# Patient Record
Sex: Female | Born: 2000 | Race: White | Hispanic: No | Marital: Single | Smoking: Current every day smoker
Health system: Southern US, Community
[De-identification: ages and names within clinical notes are randomized; demographics above are authoritative.]

## PROBLEM LIST (undated history)

## (undated) DIAGNOSIS — J40 Bronchitis, not specified as acute or chronic: Secondary | ICD-10-CM

## (undated) DIAGNOSIS — F419 Anxiety disorder, unspecified: Secondary | ICD-10-CM

## (undated) DIAGNOSIS — J189 Pneumonia, unspecified organism: Secondary | ICD-10-CM

## (undated) DIAGNOSIS — T7840XA Allergy, unspecified, initial encounter: Secondary | ICD-10-CM

## (undated) DIAGNOSIS — J45909 Unspecified asthma, uncomplicated: Secondary | ICD-10-CM

---
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
## 2019-01-23 ENCOUNTER — Other Ambulatory Visit (HOSPITAL_COMMUNITY): Payer: Self-pay | Admitting: Psychiatry

## 2019-01-23 DIAGNOSIS — L700 Acne vulgaris: Secondary | ICD-10-CM | POA: Diagnosis not present

## 2019-01-23 DIAGNOSIS — Z79899 Other long term (current) drug therapy: Secondary | ICD-10-CM | POA: Diagnosis not present

## 2019-01-24 MED FILL — ESCITALOPRAM 10 MG TABLET: 10 | 30 days supply | Qty: 45 | Fill #0

## 2019-01-25 DIAGNOSIS — L700 Acne vulgaris: Secondary | ICD-10-CM | POA: Diagnosis not present

## 2019-01-26 MED FILL — MYORISAN 40 MG CAPSULE: 40 | 30 days supply | Qty: 60 | Fill #0

## 2019-02-07 ENCOUNTER — Encounter (HOSPITAL_COMMUNITY): Payer: Self-pay | Admitting: Psychiatry

## 2019-02-07 ENCOUNTER — Other Ambulatory Visit: Payer: Self-pay

## 2019-02-07 ENCOUNTER — Ambulatory Visit (INDEPENDENT_AMBULATORY_CARE_PROVIDER_SITE_OTHER): Payer: 59 | Admitting: Psychiatry

## 2019-02-07 VITALS — BP 122/80 | HR 75 | Ht 68.5 in | Wt 144.0 lb

## 2019-02-07 DIAGNOSIS — F411 Generalized anxiety disorder: Secondary | ICD-10-CM

## 2019-02-07 DIAGNOSIS — F5102 Adjustment insomnia: Secondary | ICD-10-CM | POA: Diagnosis not present

## 2019-02-07 DIAGNOSIS — F332 Major depressive disorder, recurrent severe without psychotic features: Secondary | ICD-10-CM

## 2019-02-07 DIAGNOSIS — Z87898 Personal history of other specified conditions: Secondary | ICD-10-CM

## 2019-02-07 DIAGNOSIS — F1291 Cannabis use, unspecified, in remission: Secondary | ICD-10-CM

## 2019-02-07 NOTE — Progress Notes (Signed)
[facility] Outpatient Follow up visit   Patient Identification: Veronica Long MRN:  [id number] Date of Evaluation:  02/07/2019 Referral Source: Hospital discharge and Dr. Marlyne Beards Chief Complaint:   Chief Complaint    Follow-up; Other     Visit Diagnosis:    ICD-10-CM   1. MDD (major depressive disorder), recurrent severe, without psychosis (HCC) F33.2   2. GAD (generalized anxiety disorder) F41.1   3. History of marijuana use Z87.898   4. Adjustment insomnia F51.02     History of Present Illness:  18 years old WF. Single. Referred initially as hospital discharge Diagnosed with depression. Has been admitted for overdose on January 29th.2018  Has been doing fair with Lexapro 15 mg she did get a part-time job working at [employer] that does help her family is very supportive does not have significant flashbacks from the past or high school incident  Severity of depression; not worse  Duration: 4 years Timing: evening  Modifying factor: mom Aggravating factor; past stress      Past Psychiatric History: depression, anxiety  Previous Psychotropic Medications: Yes  zoloft wellbutrin  Substance Abuse History in the last 12 months:  Yes.    Marijuana regular use   Consequences of Substance Abuse: Medical Consequences:  depression  Past Medical History:  Past Medical History:  Diagnosis Date  . Allergy   . Anxiety   . Bronchitis   . Pneumonia    History reviewed. No pertinent surgical history.  Family Psychiatric History: family : anxiety symptoms   Family History: History reviewed. No pertinent family history.  Social History:   Social History   Socioeconomic History  . Marital status: Single    Spouse name: Not on file  . Number of children: Not on file  . Years of education: Not on file  . Highest education level: Not on file  Occupational History  . Not on file  Social Needs  . Financial resource strain: Not on file  . Food insecurity:    Worry: Not on  file    Inability: Not on file  . Transportation needs:    Medical: Not on file    Non-medical: Not on file  Tobacco Use  . Smoking status: Current Every Day Smoker    Types: E-cigarettes  . Smokeless tobacco: Never Used  Substance and Sexual Activity  . Alcohol use: Yes    Comment: at times  . Drug use: Yes    Types: Marijuana  . Sexual activity: Never    Birth control/protection: Pill  Lifestyle  . Physical activity:    Days per week: Not on file    Minutes per session: Not on file  . Stress: Not on file  Relationships  . Social connections:    Talks on phone: Not on file    Gets together: Not on file    Attends religious service: Not on file    Active member of club or organization: Not on file    Attends meetings of clubs or organizations: Not on file    Relationship status: Not on file  Other Topics Concern  . Not on file  Social History Narrative  . Not on file     Allergies:   Allergies  Allergen Reactions  . Amoxicillin Hives and Rash    Has patient had a PCN reaction causing immediate rash, facial/tongue/throat swelling, SOB or lightheadedness with hypotension: Yes Has patient had a PCN reaction causing severe rash involving mucus membranes or skin necrosis: No Has patient had  a PCN reaction that required hospitalization: No Has patient had a PCN reaction occurring within the last 10 years: No If all of the above answers are "NO", then may proceed with Cephalosporin use.     Metabolic Disorder Labs: Lab Results  Component Value Date   HGBA1C 5.2 01/06/2018   MPG 102.54 01/06/2018   No results found for: PROLACTIN Lab Results  Component Value Date   CHOL 161 01/06/2018   TRIG 83 01/06/2018   HDL 58 01/06/2018   CHOLHDL 2.8 01/06/2018   VLDL 17 01/06/2018   LDLCALC 86 01/06/2018     Current Medications: Current Outpatient Medications  Medication Sig Dispense Refill  . clindamycin (CLEOCIN T) 1 % lotion Apply 1 application topically 2 (two)  times daily.  3  . escitalopram (LEXAPRO) 10 MG tablet TAKE 1 & 1/2 TABLET BY MOUTH AT BEDTIME 45 tablet 0  . minocycline (MINOCIN,DYNACIN) 100 MG capsule Take 100 mg by mouth 2 (two) times daily.  3  . MONO-LINYAH 0.25-35 MG-MCG tablet Take 1 tablet by mouth at bedtime.   3  . MYORISAN 40 MG capsule   0   No current facility-administered medications for this visit.       Psychiatric Specialty Exam: Review of Systems  Cardiovascular: Negative for chest pain and palpitations.  Skin: Negative for itching.  Neurological: Negative for tingling.  Psychiatric/Behavioral: Negative for depression and suicidal ideas.    Blood pressure 122/80, pulse 75, height 5' 8.5" (1.74 m), weight 144 lb (65.3 kg).Body mass index is 21.58 kg/m.  General Appearance: Casual  Eye Contact:  Fair  Speech:  Normal Rate  Volume:  Decreased  Mood: fair  Affect: constricted  Thought Process:  Goal Directed  Orientation:  Full (Time, Place, and Person)  Thought Content:  Rumination  Suicidal Thoughts:  No  Homicidal Thoughts:  No  Memory:  Immediate;   Fair Recent;   Fair  Judgement:  Fair  Insight:  Shallow  Psychomotor Activity:  Normal  Concentration:  Concentration: Fair and Attention Span: Fair  Recall:  [employer] of Knowledge:Fair  Language: Fair  Akathisia:  No  Handed:  Right  AIMS (if indicated):    Assets:  Desire for Improvement  ADL's:  Intact  Cognition: WNL  Sleep:  fair    Treatment Plan Summary: Medication management and Plan as follows  Major depression severe, recurrent:doing fair. Continue lexapro 41m. 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 Discussed sleep hygiene.  Marijuana use: discussed to abstain. Understands the risk.says not using as of now Fu 2-21m   Thresa Ross, MD 3/3/20201:33 PM

## 2019-02-28 DIAGNOSIS — Z79899 Other long term (current) drug therapy: Secondary | ICD-10-CM | POA: Diagnosis not present

## 2019-02-28 DIAGNOSIS — L700 Acne vulgaris: Secondary | ICD-10-CM | POA: Diagnosis not present

## 2019-02-28 MED FILL — ESCITALOPRAM 10 MG TABLET: 10 | 30 days supply | Qty: 45 | Fill #1

## 2019-03-01 DIAGNOSIS — L700 Acne vulgaris: Secondary | ICD-10-CM | POA: Diagnosis not present

## 2019-03-01 DIAGNOSIS — L308 Other specified dermatitis: Secondary | ICD-10-CM | POA: Diagnosis not present

## 2019-03-01 MED FILL — TRIAMCINOLONE 0.1% CREAM: 0.1 | 20 days supply | Qty: 60 | Fill #0

## 2019-03-01 MED FILL — MYORISAN 40 MG CAPSULE: 40 | 30 days supply | Qty: 60 | Fill #0

## 2019-03-10 DIAGNOSIS — H10022 Other mucopurulent conjunctivitis, left eye: Secondary | ICD-10-CM | POA: Diagnosis not present

## 2019-03-10 DIAGNOSIS — H11432 Conjunctival hyperemia, left eye: Secondary | ICD-10-CM | POA: Diagnosis not present

## 2019-03-10 DIAGNOSIS — H1045 Other chronic allergic conjunctivitis: Secondary | ICD-10-CM | POA: Diagnosis not present

## 2019-03-10 MED FILL — TOBRAMYCIN-DEXAMETH OPTH SU: 0.3-0.1 | 13 days supply | Qty: 5 | Fill #0

## 2019-03-28 DIAGNOSIS — L700 Acne vulgaris: Secondary | ICD-10-CM | POA: Diagnosis not present

## 2019-03-28 DIAGNOSIS — Z79899 Other long term (current) drug therapy: Secondary | ICD-10-CM | POA: Diagnosis not present

## 2019-03-29 DIAGNOSIS — L700 Acne vulgaris: Secondary | ICD-10-CM | POA: Diagnosis not present

## 2019-04-14 ENCOUNTER — Other Ambulatory Visit (HOSPITAL_COMMUNITY): Payer: Self-pay | Admitting: Psychiatry

## 2019-04-17 ENCOUNTER — Telehealth (HOSPITAL_COMMUNITY): Payer: Self-pay | Admitting: Psychiatry

## 2019-04-17 MED ORDER — ESCITALOPRAM OXALATE 10 MG PO TABS: 15.0000 mg | ORAL_TABLET | Freq: Every day | ORAL | 1 refills | Status: DC

## 2019-04-17 MED FILL — ESCITALOPRAM 10 MG TABLET: 10 | 30 days supply | Qty: 45 | Fill #0

## 2019-04-17 NOTE — Telephone Encounter (Signed)
sent 

## 2019-04-17 NOTE — Telephone Encounter (Signed)
Pt needs refill on lexapro sent to [facility].

## 2019-04-17 NOTE — Telephone Encounter (Signed)
Pt aware.

## 2019-04-25 ENCOUNTER — Encounter (HOSPITAL_COMMUNITY): Payer: Self-pay | Admitting: Psychiatry

## 2019-04-25 ENCOUNTER — Ambulatory Visit (INDEPENDENT_AMBULATORY_CARE_PROVIDER_SITE_OTHER): Payer: 59 | Admitting: Psychiatry

## 2019-04-25 DIAGNOSIS — F411 Generalized anxiety disorder: Secondary | ICD-10-CM | POA: Diagnosis not present

## 2019-04-25 DIAGNOSIS — F1291 Cannabis use, unspecified, in remission: Secondary | ICD-10-CM

## 2019-04-25 DIAGNOSIS — Z87898 Personal history of other specified conditions: Secondary | ICD-10-CM

## 2019-04-25 DIAGNOSIS — F332 Major depressive disorder, recurrent severe without psychotic features: Secondary | ICD-10-CM

## 2019-04-25 NOTE — Progress Notes (Signed)
[facility] Outpatient Follow up visit   Patient Identification: Veronica Long MRN:  [id number] Date of Evaluation:  04/25/2019 Referral Source: Hospital discharge and Dr. Marlyne Beards Chief Complaint:   follow up depression Visit Diagnosis:    ICD-10-CM   1. MDD (major depressive disorder), recurrent severe, without psychosis (HCC) F33.2   2. GAD (generalized anxiety disorder) F41.1   3. History of marijuana use Z87.898    I connected with Beulah Gandy on 04/25/19 at  1:15 PM EDT by a video enabled telemedicine application and verified that I am speaking with the correct person using two identifiers.   I discussed the limitations of evaluation and management by telemedicine and the availability of in person appointments. The patient expressed understanding and agreed to proceed.  History of Present Illness:  18 years old WF. Single. Referred initially as hospital discharge Diagnosed with depression. Has been admitted for overdose on January 29th.2018   Doing fair on lexapro. Less depressed. Parents are supportive   Not having past high school flashbacks Severity of depression; not worse  Duration: 4 years Timing: evening  Modifying factor: mom Aggravating factor; past stress      Past Psychiatric History: depression, anxiety  Previous Psychotropic Medications: Yes  zoloft wellbutrin  Substance Abuse History in the last 12 months:  Yes.    Marijuana regular use   Consequences of Substance Abuse: Medical Consequences:  depression  Past Medical History:  Past Medical History:  Diagnosis Date  . Allergy   . Anxiety   . Bronchitis   . Pneumonia    History reviewed. No pertinent surgical history.  Family Psychiatric History: family : anxiety symptoms   Family History: History reviewed. No pertinent family history.  Social History:   Social History   Socioeconomic History  . Marital status: Single    Spouse name: Not on file  . Number of children: Not on  file  . Years of education: Not on file  . Highest education level: Not on file  Occupational History  . Not on file  Social Needs  . Financial resource strain: Not on file  . Food insecurity:    Worry: Not on file    Inability: Not on file  . Transportation needs:    Medical: Not on file    Non-medical: Not on file  Tobacco Use  . Smoking status: Current Every Day Smoker    Types: E-cigarettes  . Smokeless tobacco: Never Used  Substance and Sexual Activity  . Alcohol use: Yes    Comment: at times  . Drug use: Yes    Types: Marijuana  . Sexual activity: Never    Birth control/protection: Pill  Lifestyle  . Physical activity:    Days per week: Not on file    Minutes per session: Not on file  . Stress: Not on file  Relationships  . Social connections:    Talks on phone: Not on file    Gets together: Not on file    Attends religious service: Not on file    Active member of club or organization: Not on file    Attends meetings of clubs or organizations: Not on file    Relationship status: Not on file  Other Topics Concern  . Not on file  Social History Narrative  . Not on file     Allergies:   Allergies  Allergen Reactions  . Amoxicillin Hives and Rash    Has patient had a PCN reaction causing immediate rash, facial/tongue/throat swelling, SOB or  lightheadedness with hypotension: Yes Has patient had a PCN reaction causing severe rash involving mucus membranes or skin necrosis: No Has patient had a PCN reaction that required hospitalization: No Has patient had a PCN reaction occurring within the last 10 years: No If all of the above answers are "NO", then may proceed with Cephalosporin use.     Metabolic Disorder Labs: Lab Results  Component Value Date   HGBA1C 5.2 01/06/2018   MPG 102.54 01/06/2018   No results found for: PROLACTIN Lab Results  Component Value Date   CHOL 161 01/06/2018   TRIG 83 01/06/2018   HDL 58 01/06/2018   CHOLHDL 2.8 01/06/2018    VLDL 17 01/06/2018   LDLCALC 86 01/06/2018     Current Medications: Current Outpatient Medications  Medication Sig Dispense Refill  . clindamycin (CLEOCIN T) 1 % lotion Apply 1 application topically 2 (two) times daily.  3  . escitalopram (LEXAPRO) 10 MG tablet Take 1.5 tablets (15 mg total) by mouth at bedtime. 45 tablet 1  . minocycline (MINOCIN,DYNACIN) 100 MG capsule Take 100 mg by mouth 2 (two) times daily.  3  . MONO-LINYAH 0.25-35 MG-MCG tablet Take 1 tablet by mouth at bedtime.   3  . MYORISAN 40 MG capsule   0   No current facility-administered medications for this visit.       Psychiatric Specialty Exam: Review of Systems  Cardiovascular: Negative for chest pain and palpitations.  Skin: Negative for rash.  Psychiatric/Behavioral: Negative for depression and suicidal ideas.    There were no vitals taken for this visit.There is no height or weight on file to calculate BMI.  General Appearance: Casual  Eye Contact:  Fair  Speech:  Normal Rate  Volume:  Decreased  Mood: fair  Affect: constricted  Thought Process:  Goal Directed  Orientation:  Full (Time, Place, and Person)  Thought Content:  Rumination  Suicidal Thoughts:  No  Homicidal Thoughts:  No  Memory:  Immediate;   Fair Recent;   Fair  Judgement:  Fair  Insight:  Shallow  Psychomotor Activity:  Normal  Concentration:  Concentration: Fair and Attention Span: Fair  Recall:  [employer]  Fund of Knowledge:Fair  Language: Fair  Akathisia:  No  Handed:  Right  AIMS (if indicated):    Assets:  Desire for Improvement  ADL's:  Intact  Cognition: WNL  Sleep:  fair    Treatment Plan Summary: Medication management and Plan as follows  Major depression severe, recurrent:doing fair. Continue lexapro  GAD: fluctuates.continue lexapro Has meds, can call for refill. 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 Discussed sleep hygiene.  Marijuana ZOX:WRUEAVuse:denies recent use  I discussed the assessment and treatment plan with  the patient. The patient was provided an opportunity to ask questions and all were answered. The patient agreed with the plan and demonstrated an understanding of the instructions.   The patient was advised to call back or seek an in-person evaluation if the symptoms worsen or if the condition fails to improve as anticipated.  I provided 15 minutes of non-face-to-face time during this encounter. Fu 3920m. Thresa RossNadeem Dazhane Villagomez, MD 5/19/20201:42 PM

## 2019-05-25 MED FILL — ESCITALOPRAM 10 MG TABLET: 10 | 30 days supply | Qty: 45 | Fill #1

## 2019-07-06 MED FILL — ALYACEN 1-35-28 TABLET: 1-35 | 84 days supply | Qty: 84 | Fill #2

## 2019-07-18 ENCOUNTER — Ambulatory Visit (INDEPENDENT_AMBULATORY_CARE_PROVIDER_SITE_OTHER): Payer: 59 | Admitting: Psychiatry

## 2019-07-18 ENCOUNTER — Other Ambulatory Visit: Payer: Self-pay

## 2019-07-18 ENCOUNTER — Encounter (HOSPITAL_COMMUNITY): Payer: Self-pay | Admitting: Psychiatry

## 2019-07-18 DIAGNOSIS — F332 Major depressive disorder, recurrent severe without psychotic features: Secondary | ICD-10-CM | POA: Diagnosis not present

## 2019-07-18 DIAGNOSIS — Z87898 Personal history of other specified conditions: Secondary | ICD-10-CM | POA: Diagnosis not present

## 2019-07-18 DIAGNOSIS — F5102 Adjustment insomnia: Secondary | ICD-10-CM

## 2019-07-18 DIAGNOSIS — F1291 Cannabis use, unspecified, in remission: Secondary | ICD-10-CM

## 2019-07-18 DIAGNOSIS — F411 Generalized anxiety disorder: Secondary | ICD-10-CM

## 2019-07-18 MED ORDER — ESCITALOPRAM OXALATE 10 MG PO TABS: 15.0000 mg | ORAL_TABLET | Freq: Every day | ORAL | 2 refills | Status: DC

## 2019-07-18 NOTE — Progress Notes (Signed)
[facility] Outpatient Follow up visit   Patient Identification: Adonica Fukushima MRN:  [id number] Date of Evaluation:  07/18/2019 Referral Source: Hospital discharge and Dr. Creig Hines Chief Complaint:   follow up depression Visit Diagnosis:    ICD-10-CM   1. MDD (major depressive disorder), recurrent severe, without psychosis ([place])  F33.2   2. GAD (generalized anxiety disorder)  F41.1   3. History of marijuana use  Z87.898   4. Adjustment insomnia  F51.02   Virtual Visit via Telephone Note  I connected with Ladell Heads on 07/18/19 at  3:30 PM EDT by telephone and verified that I am speaking with the correct person using two identifiers.   I discussed the limitations, risks, security and privacy concerns of performing an evaluation and management service by telephone and the availability of in person appointments. I also discussed with the patient that there may be a patient responsible charge related to this service. The patient expressed understanding and agreed to proceed.  I discussed the assessment and treatment plan with the patient. The patient was provided an opportunity to ask questions and all were answered. The patient agreed with the plan and demonstrated an understanding of the instructions.   The patient was advised to call back or seek an in-person evaluation if the symptoms worsen or if the condition fails to improve as anticipated.  History of Present Illness:  18 years old WF. Single. Referred initially as hospital discharge Diagnosed with depression. Has been admitted for overdose on January 29th.2018  She continues to do well on lexapro. Started a job at [employer],  Parents are supportive Less concern from past and school flashbacks   Severity of depression; not worse  Duration: 4 years Timing: evening  Modifying factor: mom Aggravating factor; past stress      Past Psychiatric History: depression, anxiety  Previous Psychotropic Medications: Yes   zoloft wellbutrin  Substance Abuse History in the last 12 months:  Yes.    Marijuana regular use   Consequences of Substance Abuse: Medical Consequences:  depression  Past Medical History:  Past Medical History:  Diagnosis Date  . Allergy   . Anxiety   . Bronchitis   . Pneumonia    History reviewed. No pertinent surgical history.  Family Psychiatric History: family : anxiety symptoms   Family History: History reviewed. No pertinent family history.  Social History:   Social History   Socioeconomic History  . Marital status: Single    Spouse name: Not on file  . Number of children: Not on file  . Years of education: Not on file  . Highest education level: Not on file  Occupational History  . Not on file  Social Needs  . Financial resource strain: Not on file  . Food insecurity    Worry: Not on file    Inability: Not on file  . Transportation needs    Medical: Not on file    Non-medical: Not on file  Tobacco Use  . Smoking status: Current Every Day Smoker    Types: E-cigarettes  . Smokeless tobacco: Never Used  Substance and Sexual Activity  . Alcohol use: Yes    Comment: at times  . Drug use: Yes    Types: Marijuana  . Sexual activity: Never    Birth control/protection: Pill  Lifestyle  . Physical activity    Days per week: Not on file    Minutes per session: Not on file  . Stress: Not on file  Relationships  . Social connections  Talks on phone: Not on file    Gets together: Not on file    Attends religious service: Not on file    Active member of club or organization: Not on file    Attends meetings of clubs or organizations: Not on file    Relationship status: Not on file  Other Topics Concern  . Not on file  Social History Narrative  . Not on file     Allergies:   Allergies  Allergen Reactions  . Amoxicillin Hives and Rash    Has patient had a PCN reaction causing immediate rash, facial/tongue/throat swelling, SOB or lightheadedness  with hypotension: Yes Has patient had a PCN reaction causing severe rash involving mucus membranes or skin necrosis: No Has patient had a PCN reaction that required hospitalization: No Has patient had a PCN reaction occurring within the last 10 years: No If all of the above answers are "NO", then may proceed with Cephalosporin use.     Metabolic Disorder Labs: Lab Results  Component Value Date   HGBA1C 5.2 01/06/2018   MPG 102.54 01/06/2018   No results found for: PROLACTIN Lab Results  Component Value Date   CHOL 161 01/06/2018   TRIG 83 01/06/2018   HDL 58 01/06/2018   CHOLHDL 2.8 01/06/2018   VLDL 17 01/06/2018   LDLCALC 86 01/06/2018     Current Medications: Current Outpatient Medications  Medication Sig Dispense Refill  . clindamycin (CLEOCIN T) 1 % lotion Apply 1 application topically 2 (two) times daily.  3  . escitalopram (LEXAPRO) 10 MG tablet Take 1.5 tablets (15 mg total) by mouth at bedtime. 45 tablet 2  . minocycline (MINOCIN,DYNACIN) 100 MG capsule Take 100 mg by mouth 2 (two) times daily.  3  . MONO-LINYAH 0.25-35 MG-MCG tablet Take 1 tablet by mouth at bedtime.   3  . MYORISAN 40 MG capsule   0   No current facility-administered medications for this visit.       Psychiatric Specialty Exam: Review of Systems  Cardiovascular: Negative for chest pain and palpitations.  Skin: Negative for rash.  Psychiatric/Behavioral: Negative for depression and suicidal ideas.    There were no vitals taken for this visit.There is no height or weight on file to calculate BMI.  General Appearance: Casual  Eye Contact:  Fair  Speech:  Normal Rate  Volume:  Decreased  Mood: fair  Affect: constricted  Thought Process:  Goal Directed  Orientation:  Full (Time, Place, and Person)  Thought Content:  Rumination  Suicidal Thoughts:  No  Homicidal Thoughts:  No  Memory:  Immediate;   Fair Recent;   Fair  Judgement:  Fair  Insight:  Shallow  Psychomotor Activity:   Normal  Concentration:  Concentration: Fair and Attention Span: Fair  Recall:  FiservFair  Fund of Knowledge:Fair  Language: Fair  Akathisia:  No  Handed:  Right  AIMS (if indicated):    Assets:  Desire for Improvement  ADL's:  Intact  Cognition: WNL  Sleep:  fair    Treatment Plan Summary: Medication management and Plan as follows  Major depression severe, recurrent: doing fair, continue lexapro GAD: fluctuates but better, .continue lexapro meds refilled Insomnia: has delayed sleep phase. Not worse  Marijuana UXL:KGMWNUuse:denies recent use   Fu 5071m. Thresa RossNadeem Owais Pruett, MD 8/11/20203:46 PM

## 2019-08-01 MED FILL — ESCITALOPRAM 10 MG TABLET: 10 | 30 days supply | Qty: 45 | Fill #0

## 2019-08-08 MED FILL — TRIAMCINOLONE 0.1% CREAM: 0.1 | 20 days supply | Qty: 60 | Fill #1

## 2019-08-25 DIAGNOSIS — J209 Acute bronchitis, unspecified: Secondary | ICD-10-CM | POA: Diagnosis not present

## 2019-08-25 DIAGNOSIS — J069 Acute upper respiratory infection, unspecified: Secondary | ICD-10-CM | POA: Diagnosis not present

## 2019-08-25 DIAGNOSIS — J3089 Other allergic rhinitis: Secondary | ICD-10-CM | POA: Diagnosis not present

## 2019-09-06 MED FILL — TRIAMCINOLONE 0.1% CREAM: 0.1 | 20 days supply | Qty: 60 | Fill #2

## 2019-09-12 DIAGNOSIS — J029 Acute pharyngitis, unspecified: Secondary | ICD-10-CM | POA: Diagnosis not present

## 2019-09-12 DIAGNOSIS — R05 Cough: Secondary | ICD-10-CM | POA: Diagnosis not present

## 2019-09-12 DIAGNOSIS — J209 Acute bronchitis, unspecified: Secondary | ICD-10-CM | POA: Diagnosis not present

## 2019-09-27 MED FILL — ALYACEN 1-35-28 TABLET: 1-35 | 84 days supply | Qty: 84 | Fill #0

## 2019-10-03 MED FILL — TRIAMCINOLONE 0.1% CREAM: 0.1 | 20 days supply | Qty: 60 | Fill #2

## 2019-10-23 ENCOUNTER — Ambulatory Visit (HOSPITAL_COMMUNITY): Payer: 59 | Admitting: Psychiatry

## 2019-10-26 ENCOUNTER — Ambulatory Visit (HOSPITAL_COMMUNITY): Payer: 59 | Admitting: Psychiatry

## 2019-11-01 MED FILL — ALBUTEROL SULFATE HFA 108 (: 108 (90 BAS | 30 days supply | Qty: 18 | Fill #0

## 2019-11-04 ENCOUNTER — Encounter (HOSPITAL_BASED_OUTPATIENT_CLINIC_OR_DEPARTMENT_OTHER): Payer: Self-pay | Admitting: Emergency Medicine

## 2019-11-04 ENCOUNTER — Emergency Department (HOSPITAL_BASED_OUTPATIENT_CLINIC_OR_DEPARTMENT_OTHER)
Admission: EM | Admit: 2019-11-04 | Discharge: 2019-11-04 | Disposition: A | Discharge status: Home / Self Care | Payer: 59 | Attending: Emergency Medicine | Admitting: Emergency Medicine

## 2019-11-04 ENCOUNTER — Emergency Department (HOSPITAL_BASED_OUTPATIENT_CLINIC_OR_DEPARTMENT_OTHER): Payer: 59

## 2019-11-04 ENCOUNTER — Other Ambulatory Visit: Payer: Self-pay

## 2019-11-04 DIAGNOSIS — J069 Acute upper respiratory infection, unspecified: Secondary | ICD-10-CM | POA: Insufficient documentation

## 2019-11-04 DIAGNOSIS — J4521 Mild intermittent asthma with (acute) exacerbation: Secondary | ICD-10-CM | POA: Diagnosis not present

## 2019-11-04 DIAGNOSIS — R0602 Shortness of breath: Secondary | ICD-10-CM | POA: Diagnosis not present

## 2019-11-04 DIAGNOSIS — Z20828 Contact with and (suspected) exposure to other viral communicable diseases: Secondary | ICD-10-CM | POA: Insufficient documentation

## 2019-11-04 DIAGNOSIS — J45901 Unspecified asthma with (acute) exacerbation: Secondary | ICD-10-CM | POA: Insufficient documentation

## 2019-11-04 DIAGNOSIS — F17290 Nicotine dependence, other tobacco product, uncomplicated: Secondary | ICD-10-CM | POA: Diagnosis not present

## 2019-11-04 DIAGNOSIS — B9789 Other viral agents as the cause of diseases classified elsewhere: Secondary | ICD-10-CM | POA: Diagnosis not present

## 2019-11-04 DIAGNOSIS — R0981 Nasal congestion: Secondary | ICD-10-CM | POA: Diagnosis present

## 2019-11-04 DIAGNOSIS — Z79899 Other long term (current) drug therapy: Secondary | ICD-10-CM | POA: Diagnosis not present

## 2019-11-04 LAB — SARS CORONAVIRUS 2 (TAT 6-24 HRS): SARS Coronavirus 2: NEGATIVE

## 2019-11-04 MED ORDER — IPRATROPIUM-ALBUTEROL 0.5-2.5 (3) MG/3ML IN SOLN
3.0000 mL | Freq: Once | RESPIRATORY_TRACT | Status: AC
  Administered 2019-11-04: 3 mL via RESPIRATORY_TRACT
  Filled 2019-11-04: qty 3

## 2019-11-04 MED ORDER — DEXAMETHASONE SODIUM PHOSPHATE 10 MG/ML IJ SOLN
10.0000 mg | Freq: Once | INTRAMUSCULAR | Status: AC
  Administered 2019-11-04: 11:00:00 10 mg via INTRAMUSCULAR
  Filled 2019-11-04: qty 1

## 2019-11-04 MED ORDER — AEROCHAMBER PLUS FLO-VU MEDIUM MISC
1.0000 | Freq: Once | Status: AC
  Administered 2019-11-04: 10:00:00 1
  Filled 2019-11-04: qty 1

## 2019-11-04 MED ORDER — PREDNISONE 10 MG (21) PO TBPK: ORAL_TABLET | ORAL | 0 refills | Status: AC

## 2019-11-04 MED ORDER — OXYMETAZOLINE HCL 0.05 % NA SOLN
1.0000 | Freq: Once | NASAL | Status: AC
  Administered 2019-11-04: 1 via NASAL
  Filled 2019-11-04: qty 30

## 2019-11-04 NOTE — ED Provider Notes (Signed)
[facility] EMERGENCY DEPARTMENT Provider Note   CSN: [id number] Arrival date & time: 11/04/19  1779     History   Chief Complaint Chief Complaint  Patient presents with  . Nasal Congestion  . Shortness of Breath    HPI Veronica Long is a 18 y.o. female.     Pt presents to the ED today with a several month hx of sob and nasal congestion.  She has used an albuterol inhaler, but no spacer.  She had a rx nasal spray, but does not know the name.  She used it for about 2 weeks, but stopped because she had no relief of sx.  The pt has had coughing episodes where she can't catch her breath.  This happened today, which is why she came in.  She denies any fever or known covid exposures.  Pt does use e-cigarettes.     Past Medical History:  Diagnosis Date  . Allergy   . Anxiety   . Bronchitis   . Pneumonia     Patient Active Problem List   Diagnosis Date Noted  . MDD (major depressive disorder) 01/05/2018  . MDD (major depressive disorder), recurrent severe, without psychosis ([place]) 01/05/2018  . GAD (generalized anxiety disorder) 01/05/2018    History reviewed. No pertinent surgical history.   OB History   No obstetric history on file.      Home Medications    Prior to Admission medications   Medication Sig Start Date End Date Taking? Authorizing Provider  albuterol (VENTOLIN HFA) 108 (90 Base) MCG/ACT inhaler Inhale 2 puffs into the lungs every 4 (four) hours as needed for wheezing or shortness of breath.   Yes [provider]  escitalopram (LEXAPRO) 10 MG tablet Take 1.5 tablets (15 mg total) by mouth at bedtime. 07/18/19 07/17/20 Yes Merian Capron, MD  clindamycin (CLEOCIN T) 1 % lotion Apply 1 application topically 2 (two) times daily. 12/28/17   [provider]  minocycline (MINOCIN,DYNACIN) 100 MG capsule Take 100 mg by mouth 2 (two) times daily. 12/28/17   [provider]  [place] 0.25-35 MG-MCG tablet Take 1 tablet by  mouth at bedtime.  03/29/17   [provider]  MYORISAN 40 MG capsule  10/26/18   [provider]  predniSONE (STERAPRED UNI-PAK 21 TAB) 10 MG (21) TBPK tablet Take 6 tabs for 2 days, then 5 for 2 days, then 4 for 2 days, then 3 for 2 days, 2 for 2 days, then 1 for 2 days 11/04/19   Isla Pence, MD  traZODone (DESYREL) 50 MG tablet Take 1 tablet (50 mg total) by mouth at bedtime. Take half to one at night. Patient not taking: Reported on 11/08/2018 10/05/18 11/08/18  Merian Capron, MD    Family History History reviewed. No pertinent family history.  Social History Social History   Tobacco Use  . Smoking status: Current Every Day Smoker    Types: E-cigarettes  . Smokeless tobacco: Never Used  Substance Use Topics  . Alcohol use: Yes    Comment: at times  . Drug use: Not Currently    Types: Marijuana     Allergies   Amoxicillin   Review of Systems Review of Systems  Respiratory: Positive for cough, shortness of breath and wheezing.   All other systems reviewed and are negative.    Physical Exam Updated Vital Signs BP 126/85 (BP Location: Right Arm)   Pulse 78   Temp 98.5 F (36.9 C) (Oral)   Resp 17  LMP  (LMP Unknown)   SpO2 96%   Physical Exam Vitals signs and nursing note reviewed.  Constitutional:      Appearance: She is well-developed.  HENT:     Head: Normocephalic and atraumatic.     Mouth/Throat:     Mouth: Mucous membranes are moist.     Pharynx: Oropharynx is clear.  Eyes:     Extraocular Movements: Extraocular movements intact.     Pupils: Pupils are equal, round, and reactive to light.  Neck:     Musculoskeletal: Normal range of motion and neck supple.  Cardiovascular:     Rate and Rhythm: Normal rate and regular rhythm.  Pulmonary:     Effort: Pulmonary effort is normal.     Breath sounds: Wheezing present.  Abdominal:     General: Bowel sounds are normal.     Palpations: Abdomen is soft.  Musculoskeletal: Normal  range of motion.  Skin:    General: Skin is warm.     Capillary Refill: Capillary refill takes less than 2 seconds.  Neurological:     General: No focal deficit present.     Mental Status: She is alert and oriented to person, place, and time.  Psychiatric:        Mood and Affect: Mood normal.        Behavior: Behavior normal.      ED Treatments / Results  Labs (all labs ordered are listed, but only abnormal results are displayed) Labs Reviewed  SARS CORONAVIRUS 2 (TAT 6-24 HRS)    EKG EKG Interpretation  Date/Time:  Saturday November 04 2019 09:41:44 EST Ventricular Rate:  75 PR Interval:    QRS Duration: 81 QT Interval:  377 QTC Calculation: 421 R Axis:   84 Text Interpretation: Sinus rhythm Atrial premature complexes ST elev, probable normal early repol pattern No significant change since last tracing Confirmed by Jacalyn Lefevre [phone]) on 11/04/2019 9:44:30 AM   Radiology Dg Chest Portable 1 View  Result Date: 11/04/2019 CLINICAL DATA:  Shortness of breath for 5 months. EXAM: PORTABLE CHEST 1 VIEW COMPARISON:  Chest x-ray dated 08/07/2015. FINDINGS: The heart size and mediastinal contours are within normal limits. Both lungs are clear. The visualized skeletal structures are unremarkable. IMPRESSION: No active disease.  No evidence of pneumonia or pulmonary edema. Electronically Signed   By: Bary Richard M.D.   On: 11/04/2019 11:53    Procedures Procedures (including critical care time)  Medications Ordered in ED Medications  ipratropium-albuterol (DUONEB) 0.5-2.5 (3) MG/3ML nebulizer solution 3 mL (3 mLs Nebulization Given 11/04/19 1008)  dexamethasone (DECADRON) injection 10 mg (10 mg Intramuscular Given 11/04/19 1034)  AeroChamber Plus Flo-Vu Medium MISC 1 each (1 each Other Given 11/04/19 1009)  oxymetazoline (AFRIN) 0.05 % nasal spray 1 spray (1 spray Each Nare Given 11/04/19 1032)     Initial Impression / Assessment and Plan / ED Course  I have reviewed  the triage vital signs and the nursing notes.  Pertinent labs & imaging results that were available during my care of the patient were reviewed by me and considered in my medical decision making (see chart for details).        Pt is feeling much better.  She is encouraged not to smoke/vape.  She is swabbed for covid.  This is pending.  Pt is stable for d/c.  Return if worse.  Veronica Long was evaluated in Emergency Department on 11/04/2019 for the symptoms described in the history of present illness. She was evaluated  in the context of the global COVID-19 pandemic, which necessitated consideration that the patient might be at risk for infection with the SARS-CoV-2 virus that causes COVID-19. Institutional protocols and algorithms that pertain to the evaluation of patients at risk for COVID-19 are in a state of rapid change based on information released by regulatory bodies including the CDC and federal and state organizations. These policies and algorithms were followed during the patient's care in the ED.  Final Clinical Impressions(s) / ED Diagnoses   Final diagnoses:  Viral upper respiratory tract infection  Mild intermittent reactive airway disease with acute exacerbation    ED Discharge Orders         Ordered    predniSONE (STERAPRED UNI-PAK 21 TAB) 10 MG (21) TBPK tablet     11/04/19 1201           Jacalyn LefevreHaviland, Namita Yearwood, MD 11/04/19 1202

## 2019-11-04 NOTE — ED Triage Notes (Signed)
Pt here with nasal congestion and SOB x 5 months. States that an albuterol inhaler worked at first, but it has continued to get worse and her doctor will not see her due to [place].

## 2019-11-04 NOTE — Discharge Instructions (Addendum)
Try not to vape or smoke.

## 2019-11-21 DIAGNOSIS — J4540 Moderate persistent asthma, uncomplicated: Secondary | ICD-10-CM | POA: Diagnosis not present

## 2019-11-21 DIAGNOSIS — Z0000 Encounter for general adult medical examination without abnormal findings: Secondary | ICD-10-CM | POA: Diagnosis not present

## 2019-11-21 DIAGNOSIS — F331 Major depressive disorder, recurrent, moderate: Secondary | ICD-10-CM | POA: Diagnosis not present

## 2019-11-21 DIAGNOSIS — Z3041 Encounter for surveillance of contraceptive pills: Secondary | ICD-10-CM | POA: Diagnosis not present

## 2019-11-21 DIAGNOSIS — L2082 Flexural eczema: Secondary | ICD-10-CM | POA: Diagnosis not present

## 2019-11-21 MED FILL — TRIAMCINOLONE 0.1% CREAM: 0.1 | 15 days supply | Qty: 15 | Fill #0

## 2019-11-21 MED FILL — SYMBICORT 80-4.5 MCG INH: 80-4.5 | 30 days supply | Qty: 10 | Fill #0

## 2019-12-05 MED FILL — ALYACEN 1-35-28 TABLET: 1-35 | 84 days supply | Qty: 84 | Fill #0

## 2020-01-23 MED FILL — SYMBICORT 80-4.5 MCG INH: 80-4.5 | 30 days supply | Qty: 10 | Fill #1

## 2020-02-03 ENCOUNTER — Emergency Department (HOSPITAL_BASED_OUTPATIENT_CLINIC_OR_DEPARTMENT_OTHER): Payer: 59

## 2020-02-03 ENCOUNTER — Emergency Department (HOSPITAL_BASED_OUTPATIENT_CLINIC_OR_DEPARTMENT_OTHER)
Admission: EM | Admit: 2020-02-03 | Discharge: 2020-02-04 | Disposition: A | Discharge status: Home / Self Care | Payer: 59 | Attending: Emergency Medicine | Admitting: Emergency Medicine

## 2020-02-03 ENCOUNTER — Other Ambulatory Visit: Payer: Self-pay

## 2020-02-03 ENCOUNTER — Encounter (HOSPITAL_BASED_OUTPATIENT_CLINIC_OR_DEPARTMENT_OTHER): Payer: Self-pay

## 2020-02-03 DIAGNOSIS — R0789 Other chest pain: Secondary | ICD-10-CM | POA: Insufficient documentation

## 2020-02-03 DIAGNOSIS — Z880 Allergy status to penicillin: Secondary | ICD-10-CM | POA: Diagnosis not present

## 2020-02-03 DIAGNOSIS — J45909 Unspecified asthma, uncomplicated: Secondary | ICD-10-CM | POA: Diagnosis not present

## 2020-02-03 DIAGNOSIS — Z79899 Other long term (current) drug therapy: Secondary | ICD-10-CM | POA: Diagnosis not present

## 2020-02-03 DIAGNOSIS — F17290 Nicotine dependence, other tobacco product, uncomplicated: Secondary | ICD-10-CM | POA: Diagnosis not present

## 2020-02-03 DIAGNOSIS — R079 Chest pain, unspecified: Secondary | ICD-10-CM | POA: Diagnosis not present

## 2020-02-03 HISTORY — DX: Unspecified asthma, uncomplicated: J45.909

## 2020-02-03 MED ORDER — DEXAMETHASONE SODIUM PHOSPHATE 10 MG/ML IJ SOLN
10.0000 mg | Freq: Once | INTRAMUSCULAR | Status: AC
  Administered 2020-02-04: 10 mg via INTRAVENOUS
  Filled 2020-02-03: qty 1

## 2020-02-03 NOTE — Discharge Instructions (Addendum)
Apply ice as needed.  Continue taking naproxen - two tablets, twice a day.  Take acetaminophen as needed for additional pain relief.  Return if symptoms are getting worse.

## 2020-02-03 NOTE — ED Triage Notes (Signed)
Pt c/o localized tenderness and pain to the chest without radiation to mid sternal area x 1 day. Pain is described as dull and lingering with periods of sharpness. Denies ShOB, nausea, or diaphoresis. Pain hurts with deep inspiration.

## 2020-02-03 NOTE — ED Provider Notes (Signed)
[facility] EMERGENCY DEPARTMENT Provider Note   CSN: [id number] Arrival date & time: 02/03/20  2235   History Chief Complaint  Patient presents with  . Chest Pain    Veronica Long is a 19 y.o. female.  The history is provided by the patient.  Chest Pain She has history of asthma and comes in with chest pain which started 2 days ago.  It seemed to resolve and then recurred yesterday evening.  Pain is dull with occasional sharp components and is located just to the left of the sternum.  It is worse with palpation but not with movement, breathing, exertion.  Is not affected by body position or eating.  Pain is 4/10 at baseline and will occasionally get to 5/10.  There is no associated dyspnea, nausea, diaphoresis.  She denies cough or fever.  She denies loss of sense of smell or taste.  There has been no diarrhea.  She denies arthralgias or myalgias.  She does state that she had lifted a heavy bucket of ice several days ago at work and she did feel some pain in her back at that time.  She has taken acetaminophen and naproxen without relief.  She denies history of diabetes, hypertension, hyperlipidemia and denies family history of immature coronary atherosclerosis.  She does not smoke but she does vape.  She states she actually did not vape today because of the pain.  She denies exogenous estrogen use, recent surgery or travel, history of DVT or pulmonary embolism.   Past Medical History:  Diagnosis Date  . Allergy   . Anxiety   . Asthma   . Bronchitis   . Pneumonia     Patient Active Problem List   Diagnosis Date Noted  . MDD (major depressive disorder) 01/05/2018  . MDD (major depressive disorder), recurrent severe, without psychosis ([facility]) 01/05/2018  . GAD (generalized anxiety disorder) 01/05/2018    History reviewed. No pertinent surgical history.   OB History   No obstetric history on file.     No family history on file.  Social History   Tobacco Use  .  Smoking status: Current Every Day Smoker    Types: E-cigarettes  . Smokeless tobacco: Never Used  Substance Use Topics  . Alcohol use: Not Currently    Comment: at times  . Drug use: Yes    Types: Marijuana    Home Medications Prior to Admission medications   Medication Sig Start Date End Date Taking? Authorizing Provider  albuterol (VENTOLIN HFA) 108 (90 Base) MCG/ACT inhaler Inhale 2 puffs into the lungs every 4 (four) hours as needed for wheezing or shortness of breath.    [provider]  clindamycin (CLEOCIN T) 1 % lotion Apply 1 application topically 2 (two) times daily. 12/28/17   [provider]  escitalopram (LEXAPRO) 10 MG tablet Take 1.5 tablets (15 mg total) by mouth at bedtime. 07/18/19 07/17/20  Thresa Ross, MD  minocycline (MINOCIN,DYNACIN) 100 MG capsule Take 100 mg by mouth 2 (two) times daily. 12/28/17   [provider]  MONO-LINYAH 0.25-35 MG-MCG tablet Take 1 tablet by mouth at bedtime.  03/29/17   [provider]  MYORISAN 40 MG capsule  10/26/18   [provider]  predniSONE (STERAPRED UNI-PAK 21 TAB) 10 MG (21) TBPK tablet Take 6 tabs for 2 days, then 5 for 2 days, then 4 for 2 days, then 3 for 2 days, 2 for 2 days, then 1 for 2 days 11/04/19   Jacalyn Lefevre,  MD  traZODone (DESYREL) 50 MG tablet Take 1 tablet (50 mg total) by mouth at bedtime. Take half to one at night. Patient not taking: Reported on 11/08/2018 10/05/18 11/08/18  Thresa Ross, MD    Allergies    Amoxicillin  Review of Systems   Review of Systems  Cardiovascular: Positive for chest pain.  All other systems reviewed and are negative.   Physical Exam Updated Vital Signs Pulse 62   Temp 98.7 F (37.1 C) (Oral)   Resp 18   Ht 5' 8.5" (1.74 m)   Wt 67.9 kg   LMP 01/03/2020   SpO2 98%   BMI 22.45 kg/m   Physical Exam Vitals and nursing note reviewed.   19 year old female, resting comfortably and in no acute distress. Vital signs are normal.  Oxygen saturation is 98%, which is normal. Head is normocephalic and atraumatic. PERRLA, EOMI. Oropharynx is clear. Neck is nontender and supple without adenopathy or JVD. Back is nontender and there is no CVA tenderness. Lungs are clear without rales, wheezes, or rhonchi. Chest has very localized tenderness at the left costochondral junction about two thirds the way down the sternum.  There is no crepitus. Heart has regular rate and rhythm without murmur. Abdomen is soft, flat, nontender without masses or hepatosplenomegaly and peristalsis is normoactive. Extremities have no cyanosis or edema, full range of motion is present. Skin is warm and dry without rash. Neurologic: Mental status is normal, cranial nerves are intact, there are no motor or sensory deficits.   ED Results / Procedures / Treatments    EKG EKG Interpretation  Date/Time:  Saturday February 03 2020 22:56:01 EST Ventricular Rate:  57 PR Interval:    QRS Duration: 83 QT Interval:  409 QTC Calculation: 399 R Axis:   80 Text Interpretation: Sinus rhythm ST elev, probable normal early repol pattern When compared with ECG of 11/04/2019, Premature atrial complexes are no longer present Confirmed by Veronica Long ([id number]) on 02/03/2020 11:00:27 PM   Radiology No results found.  Procedures Procedures   Medications Ordered in ED Medications - No data to display  ED Course  I have reviewed the triage vital signs and the nursing notes.  Pertinent imaging results that were available during my care of the patient were reviewed by me and considered in my medical decision making (see chart for details).  MDM Rules/Calculators/A&P Chest pain which seems to be chest wall pain, possible costochondritis, possible intercostal muscle strain.  ECG has borderline sinus tachycardia and is otherwise normal.  Heart score is 0 which puts her at low risk for major adverse cardiac events in the next 6 weeks.  Pulmonary embolism is ruled  out by both Wells criteria and PERC criteria, no need for D-dimer to rule out pulmonary embolism.  Old records are reviewed, and she has no relevant past visits.  Will check chest x-ray to rule out pneumonia.  Chest x-ray is normal.  She is given a dose of dexamethasone and is advised to use topical measures such as ice and continue using over-the-counter NSAIDs.  Supplement with acetaminophen as needed for additional pain relief.  Return precautions discussed.  Veronica Long was evaluated in Emergency Department on 02/04/2020 for the symptoms described in the history of present illness. She was evaluated in the context of the global COVID-19 pandemic, which necessitated consideration that the patient might be at risk for infection with the SARS-CoV-2 virus that causes COVID-19. Institutional protocols and algorithms that pertain to the evaluation of  patients at risk for COVID-19 are in a state of rapid change based on information released by regulatory bodies including the CDC and federal and state organizations. These policies and algorithms were followed during the patient's care in the ED.  Final Clinical Impression(s) / ED Diagnoses Final diagnoses:  Chest wall pain    Rx / DC Orders ED Discharge Orders    None       Veronica Fuel, MD 30/05/11 [phone]

## 2020-02-04 DIAGNOSIS — Z880 Allergy status to penicillin: Secondary | ICD-10-CM | POA: Diagnosis not present

## 2020-02-04 DIAGNOSIS — F17290 Nicotine dependence, other tobacco product, uncomplicated: Secondary | ICD-10-CM | POA: Diagnosis not present

## 2020-02-04 DIAGNOSIS — J45909 Unspecified asthma, uncomplicated: Secondary | ICD-10-CM | POA: Diagnosis not present

## 2020-02-04 DIAGNOSIS — Z79899 Other long term (current) drug therapy: Secondary | ICD-10-CM | POA: Diagnosis not present

## 2020-02-04 DIAGNOSIS — R0789 Other chest pain: Secondary | ICD-10-CM | POA: Diagnosis not present

## 2020-03-12 DIAGNOSIS — F331 Major depressive disorder, recurrent, moderate: Secondary | ICD-10-CM | POA: Diagnosis not present

## 2020-03-12 DIAGNOSIS — J4540 Moderate persistent asthma, uncomplicated: Secondary | ICD-10-CM | POA: Diagnosis not present

## 2020-03-12 DIAGNOSIS — L2082 Flexural eczema: Secondary | ICD-10-CM | POA: Diagnosis not present

## 2020-03-12 MED FILL — TRIAMCINOLONE 0.1% CREAM: 0.1 | 15 days supply | Qty: 15 | Fill #1

## 2020-03-12 MED FILL — MONTELUKAST SOD 10 MG TAB: 10 | 30 days supply | Qty: 30 | Fill #0

## 2020-03-21 ENCOUNTER — Other Ambulatory Visit (HOSPITAL_BASED_OUTPATIENT_CLINIC_OR_DEPARTMENT_OTHER): Payer: Self-pay | Admitting: Family Medicine

## 2020-03-21 MED FILL — SYMBICORT 80-4.5 MCG INH: 80-4.5 | 60 days supply | Qty: 10 | Fill #0

## 2020-03-31 IMAGING — CR DG CHEST 2V
2 series · 2 of 2 positions shown · non-contrast
Comparison: Chest radiograph dated 11/04/2019.

CLINICAL DATA: 19-year-old female with chest pain.

EXAM:
CHEST - 2 VIEW

[w chest pa *]
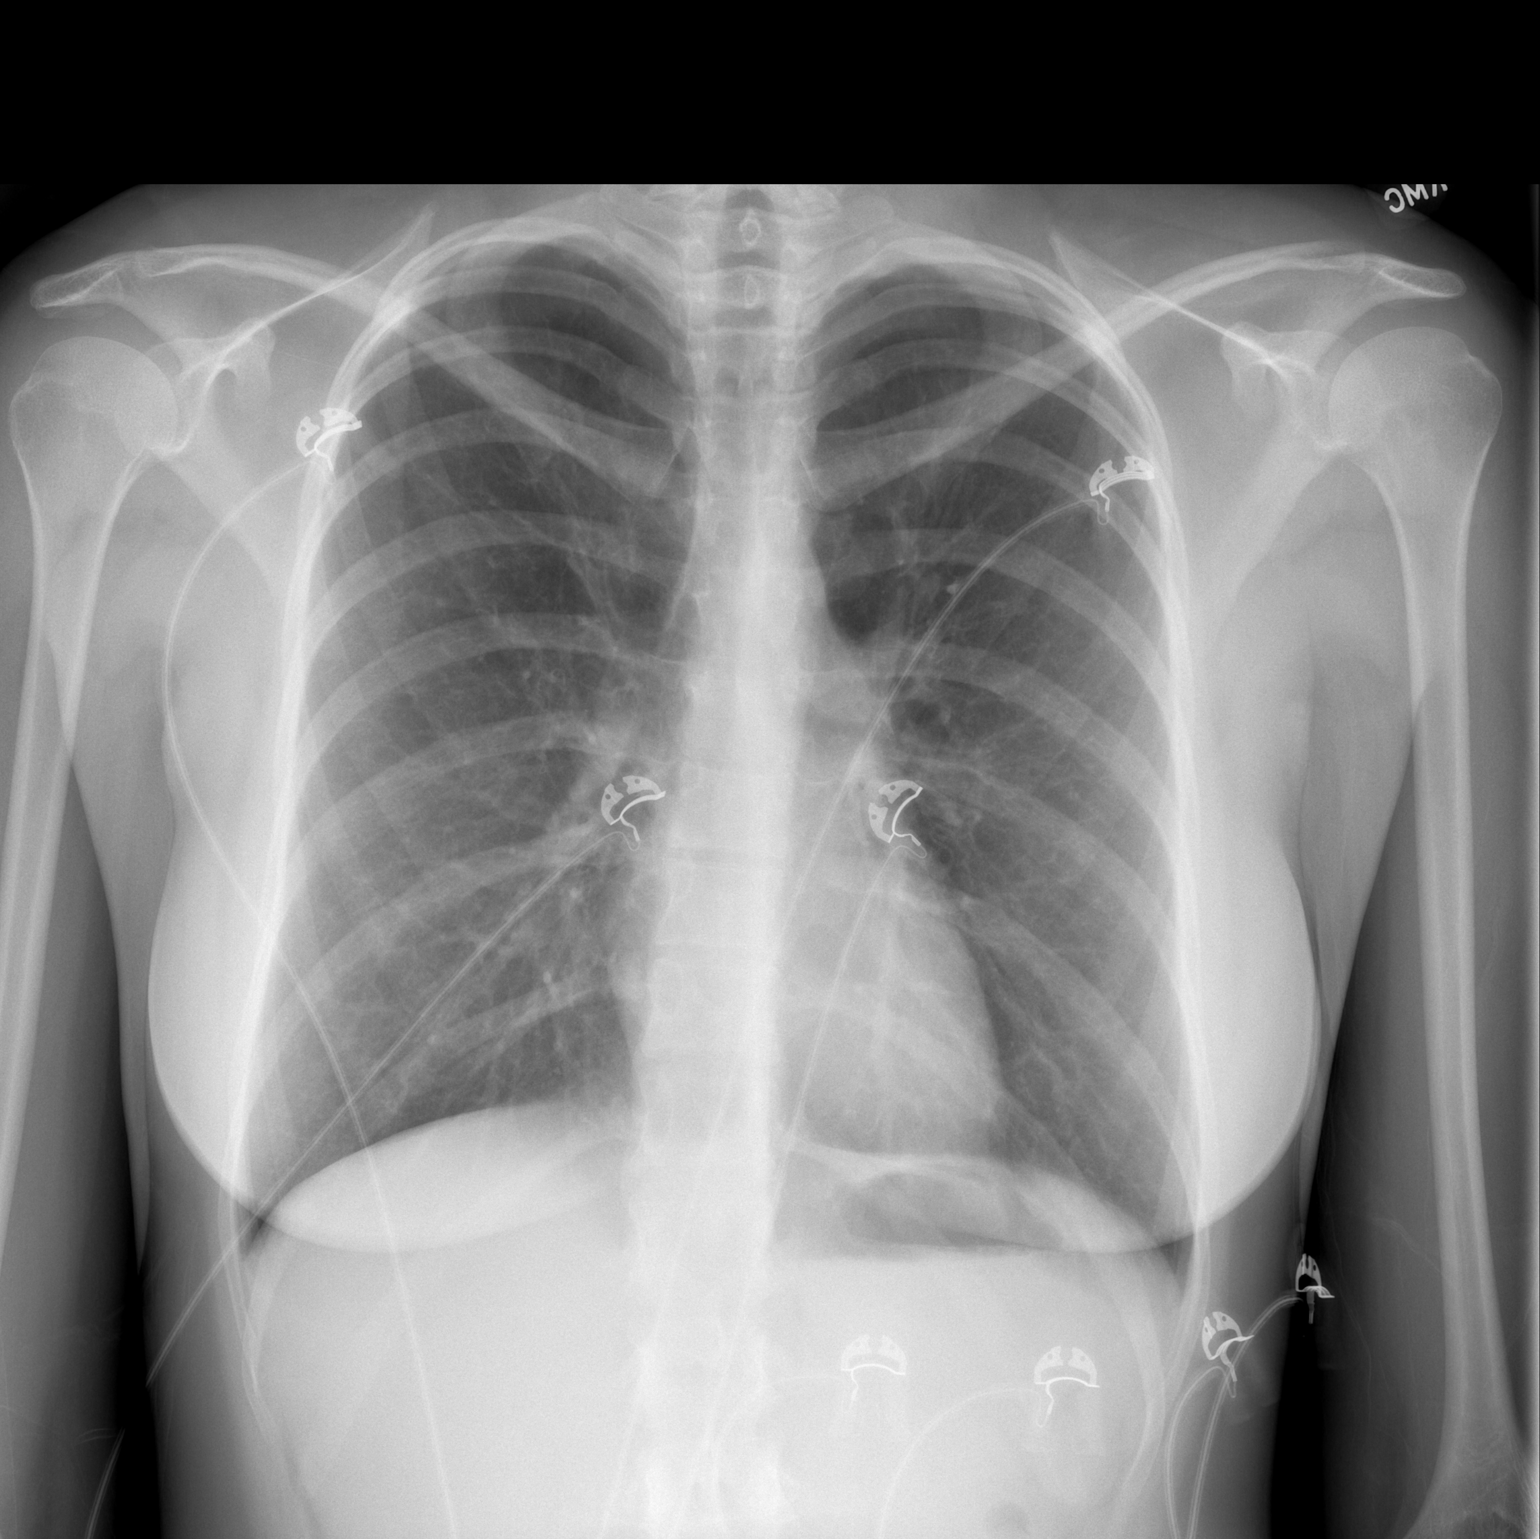

[w chest lat]
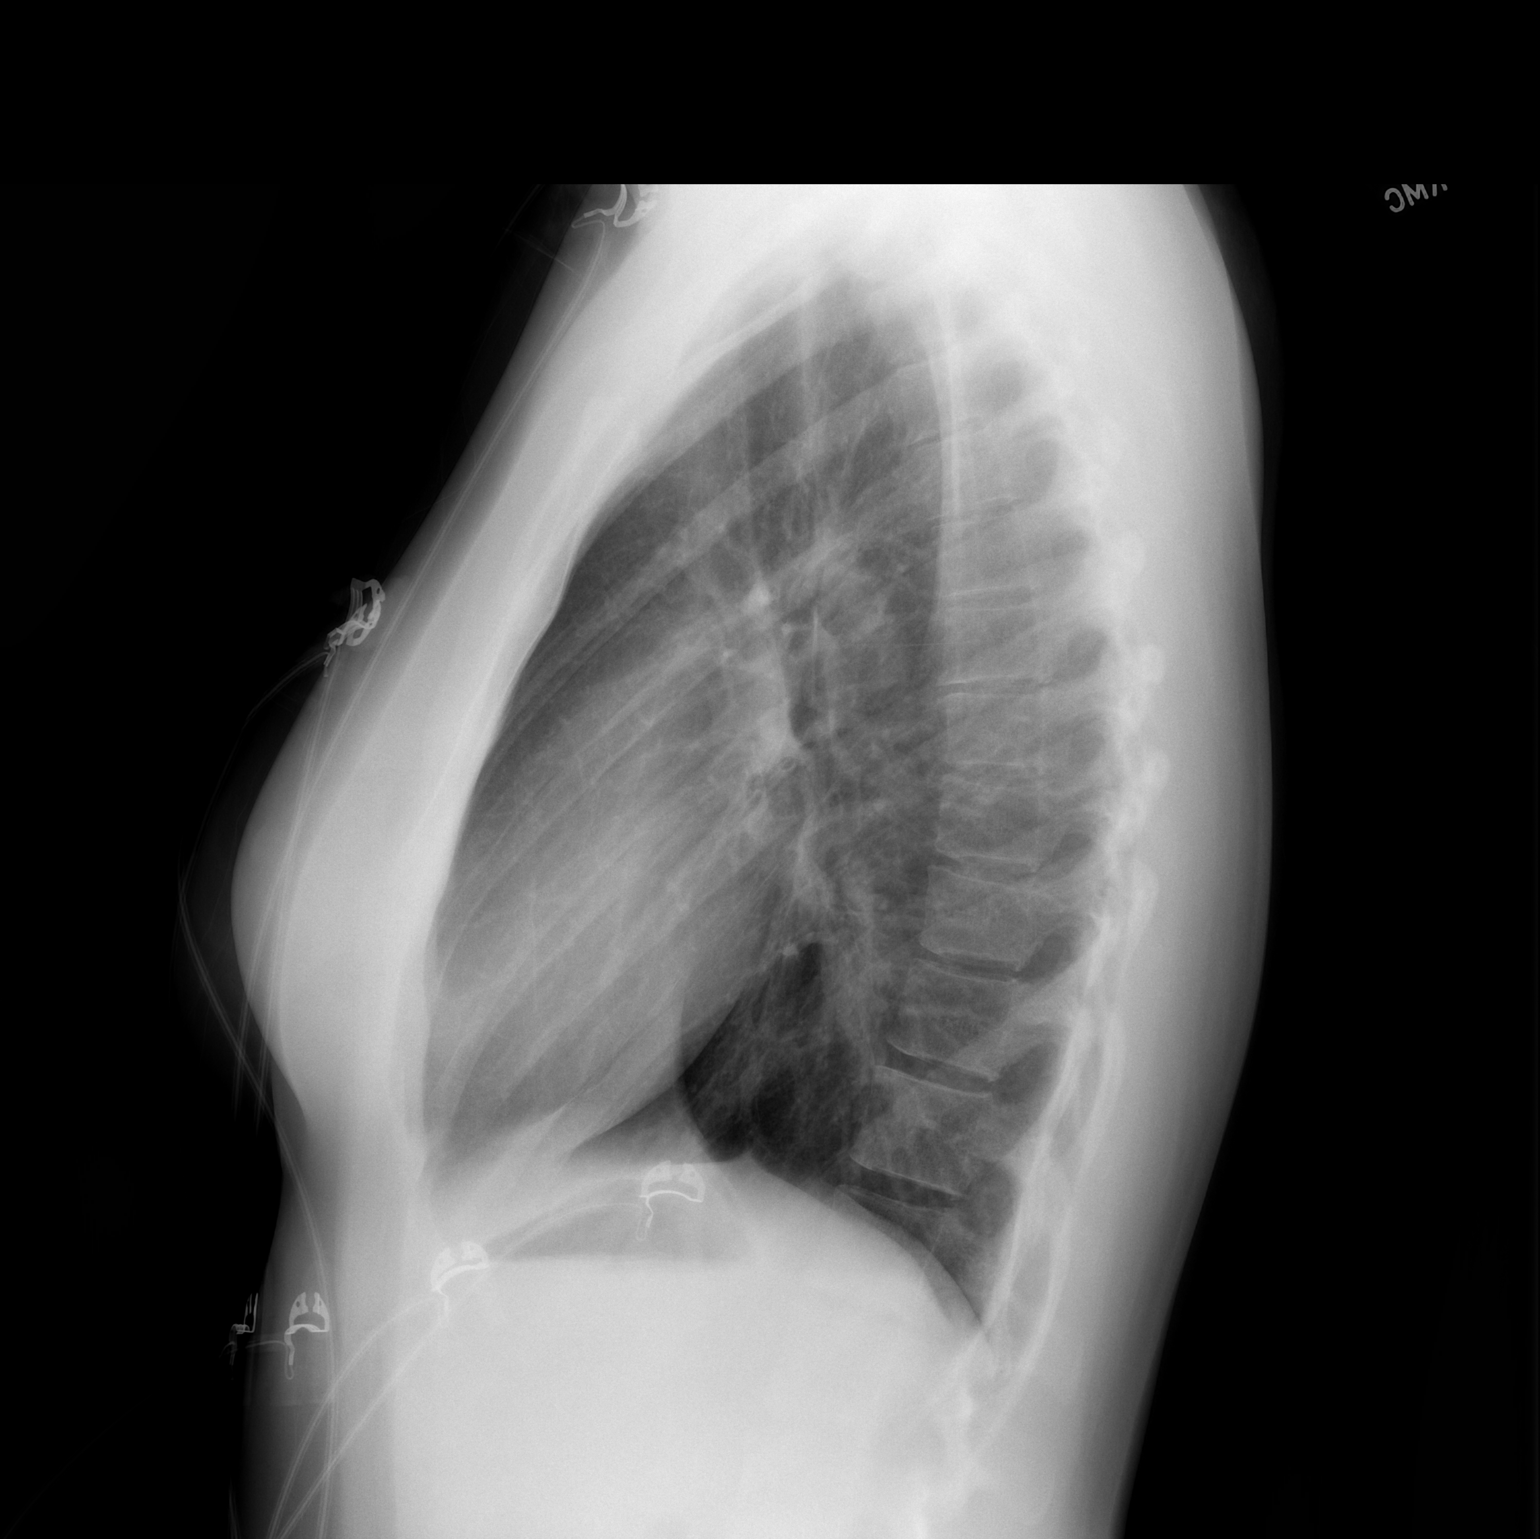

[2 of 2 positions shown; findings below may reference images not displayed]

FINDINGS: The heart size and mediastinal contours are within normal limits.
Both lungs are clear. The visualized skeletal structures are
unremarkable.
IMPRESSION: No active cardiopulmonary disease.

## 2020-04-03 ENCOUNTER — Telehealth (HOSPITAL_COMMUNITY): Payer: 59 | Admitting: Psychiatry

## 2020-04-05 ENCOUNTER — Encounter (HOSPITAL_COMMUNITY): Payer: Self-pay | Admitting: Psychiatry

## 2020-04-05 ENCOUNTER — Telehealth (INDEPENDENT_AMBULATORY_CARE_PROVIDER_SITE_OTHER): Payer: 59 | Admitting: Psychiatry

## 2020-04-05 DIAGNOSIS — F5102 Adjustment insomnia: Secondary | ICD-10-CM

## 2020-04-05 DIAGNOSIS — F332 Major depressive disorder, recurrent severe without psychotic features: Secondary | ICD-10-CM

## 2020-04-05 DIAGNOSIS — F411 Generalized anxiety disorder: Secondary | ICD-10-CM | POA: Diagnosis not present

## 2020-04-05 MED ORDER — ESCITALOPRAM OXALATE 10 MG PO TABS: 10.0000 mg | ORAL_TABLET | Freq: Every day | ORAL | 0 refills | Status: DC

## 2020-04-05 MED FILL — ESCITALOPRAM 10 MG TABLET: 10 | 30 days supply | Qty: 30 | Fill #0
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
## 2020-04-08 MED FILL — TRIAMCINOLONE 0.1% CREAM: 0.1 | 15 days supply | Qty: 15 | Fill #2

## 2020-04-09 MED FILL — ALBUTEROL SULFATE HFA 108 (: 108 (90 BAS | 90 days supply | Qty: 54 | Fill #0

## 2020-05-03 ENCOUNTER — Encounter (HOSPITAL_COMMUNITY): Payer: Self-pay | Admitting: Psychiatry

## 2020-05-03 ENCOUNTER — Telehealth (INDEPENDENT_AMBULATORY_CARE_PROVIDER_SITE_OTHER): Payer: 59 | Admitting: Psychiatry

## 2020-05-03 DIAGNOSIS — F332 Major depressive disorder, recurrent severe without psychotic features: Secondary | ICD-10-CM | POA: Diagnosis not present

## 2020-05-03 DIAGNOSIS — F5102 Adjustment insomnia: Secondary | ICD-10-CM

## 2020-05-03 DIAGNOSIS — F411 Generalized anxiety disorder: Secondary | ICD-10-CM

## 2020-05-03 MED ORDER — ESCITALOPRAM OXALATE 10 MG PO TABS: 10.0000 mg | ORAL_TABLET | Freq: Every day | ORAL | 1 refills | Status: DC

## 2020-05-03 MED FILL — ESCITALOPRAM 10 MG TABLET: 10 | 30 days supply | Qty: 30 | Fill #0
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
## 2020-05-20 MED FILL — SYMBICORT 80-4.5 MCG INH: 80-4.5 | 60 days supply | Qty: 10 | Fill #1

## 2020-05-20 MED FILL — TRIAMCINOLONE 0.1% CREAM: 0.1 | 15 days supply | Qty: 15 | Fill #3

## 2020-06-17 MED FILL — ESCITALOPRAM 10 MG TABLET: 10 | 30 days supply | Qty: 30 | Fill #1

## 2020-06-18 ENCOUNTER — Other Ambulatory Visit (HOSPITAL_BASED_OUTPATIENT_CLINIC_OR_DEPARTMENT_OTHER): Payer: Self-pay | Admitting: Family Medicine

## 2020-06-18 MED FILL — TRIAMCINOLONE 0.1% CREAM: 0.1 | 14 days supply | Qty: 15 | Fill #0

## 2020-06-28 ENCOUNTER — Telehealth (HOSPITAL_COMMUNITY): Payer: 59 | Admitting: Psychiatry

## 2020-07-16 DIAGNOSIS — L308 Other specified dermatitis: Secondary | ICD-10-CM | POA: Diagnosis not present

## 2020-07-17 MED FILL — BETAMETHASONE DP AUG 0.05%: 0.05 | 30 days supply | Qty: 45 | Fill #0

## 2020-07-25 ENCOUNTER — Other Ambulatory Visit (HOSPITAL_COMMUNITY): Payer: Self-pay

## 2020-07-25 MED ORDER — ESCITALOPRAM OXALATE 10 MG PO TABS: 10.0000 mg | ORAL_TABLET | Freq: Every day | ORAL | 1 refills | Status: DC

## 2020-07-25 MED FILL — ESCITALOPRAM 10 MG TABLET: 10 | 30 days supply | Qty: 30 | Fill #0

## 2020-08-02 MED FILL — SYMBICORT 80-4.5 MCG INH: 80-4.5 | 60 days supply | Qty: 10 | Fill #2

## 2020-08-29 ENCOUNTER — Telehealth (INDEPENDENT_AMBULATORY_CARE_PROVIDER_SITE_OTHER): Payer: 59 | Admitting: Psychiatry

## 2020-08-29 ENCOUNTER — Encounter (HOSPITAL_COMMUNITY): Payer: Self-pay | Admitting: Psychiatry

## 2020-08-29 ENCOUNTER — Other Ambulatory Visit (HOSPITAL_COMMUNITY): Payer: Self-pay | Admitting: Psychiatry

## 2020-08-29 DIAGNOSIS — F5102 Adjustment insomnia: Secondary | ICD-10-CM

## 2020-08-29 DIAGNOSIS — F411 Generalized anxiety disorder: Secondary | ICD-10-CM

## 2020-08-29 DIAGNOSIS — F332 Major depressive disorder, recurrent severe without psychotic features: Secondary | ICD-10-CM

## 2020-08-29 MED ORDER — ESCITALOPRAM OXALATE 10 MG PO TABS: 15.0000 mg | ORAL_TABLET | Freq: Every day | ORAL | 1 refills | Status: DC

## 2020-08-29 MED FILL — ESCITALOPRAM 10 MG TABLET: 10 | 30 days supply | Qty: 45 | Fill #0
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
## 2020-09-12 MED FILL — ALBUTEROL SULFATE HFA 108 (: 108 (90 BAS | 90 days supply | Qty: 54 | Fill #1

## 2020-09-24 MED FILL — TRIAMCINOLONE 0.1% CREAM: 0.1 | 14 days supply | Qty: 15 | Fill #1

## 2020-09-25 ENCOUNTER — Telehealth (HOSPITAL_COMMUNITY): Payer: 59 | Admitting: Psychiatry

## 2020-10-04 MED FILL — SYMBICORT 80-4.5 MCG INH: 80-4.5 | 60 days supply | Qty: 10 | Fill #3

## 2020-10-17 ENCOUNTER — Other Ambulatory Visit (HOSPITAL_BASED_OUTPATIENT_CLINIC_OR_DEPARTMENT_OTHER): Payer: Self-pay | Admitting: Oral and Maxillofacial Surgery

## 2020-10-17 MED FILL — HYDROCODON-APAP 10-325: 10-325 | 3 days supply | Qty: 12 | Fill #0

## 2020-10-17 MED FILL — CLINDAMYCIN HCL 150 MG CAPS: 150 | 5 days supply | Qty: 15 | Fill #0

## 2020-10-30 MED FILL — ESCITALOPRAM 10 MG TABLET: 10 | 30 days supply | Qty: 45 | Fill #1

## 2020-11-04 MED FILL — TRIAMCINOLONE 0.1% CREAM: 0.1 | 14 days supply | Qty: 15 | Fill #2

## 2020-11-22 ENCOUNTER — Other Ambulatory Visit (HOSPITAL_BASED_OUTPATIENT_CLINIC_OR_DEPARTMENT_OTHER): Payer: Self-pay | Admitting: Family Medicine

## 2020-11-22 MED FILL — SYMBICORT 80-4.5 MCG INH: 80-4.5 | 60 days supply | Qty: 10 | Fill #0

## 2020-12-23 MED FILL — ESCITALOPRAM 10 MG TABLET: 10 | 30 days supply | Qty: 30 | Fill #1

## 2021-01-20 MED FILL — TRIAMCINOLONE 0.1% CREAM: 0.1 | 14 days supply | Qty: 15 | Fill #3

## 2021-01-30 ENCOUNTER — Other Ambulatory Visit (HOSPITAL_COMMUNITY): Payer: Self-pay | Admitting: Psychiatry

## 2021-01-31 ENCOUNTER — Other Ambulatory Visit (HOSPITAL_COMMUNITY): Payer: Self-pay | Admitting: Psychiatry

## 2021-01-31 MED FILL — ESCITALOPRAM 10 MG TABLET: 10 | 30 days supply | Qty: 30 | Fill #0

## 2021-02-04 MED FILL — SYMBICORT 80-4.5 MCG INH: 80-4.5 | 60 days supply | Qty: 10 | Fill #1

## 2021-02-18 ENCOUNTER — Encounter (HOSPITAL_COMMUNITY): Payer: Self-pay | Admitting: Psychiatry

## 2021-02-18 ENCOUNTER — Other Ambulatory Visit (HOSPITAL_BASED_OUTPATIENT_CLINIC_OR_DEPARTMENT_OTHER): Payer: Self-pay | Admitting: Family Medicine

## 2021-02-18 ENCOUNTER — Telehealth (INDEPENDENT_AMBULATORY_CARE_PROVIDER_SITE_OTHER): Payer: 59 | Admitting: Psychiatry

## 2021-02-18 DIAGNOSIS — F332 Major depressive disorder, recurrent severe without psychotic features: Secondary | ICD-10-CM

## 2021-02-18 DIAGNOSIS — J4540 Moderate persistent asthma, uncomplicated: Secondary | ICD-10-CM | POA: Diagnosis not present

## 2021-02-18 DIAGNOSIS — F411 Generalized anxiety disorder: Secondary | ICD-10-CM | POA: Diagnosis not present

## 2021-02-18 DIAGNOSIS — J309 Allergic rhinitis, unspecified: Secondary | ICD-10-CM | POA: Diagnosis not present

## 2021-02-18 MED ORDER — ESCITALOPRAM OXALATE 10 MG PO TABS: 10.0000 mg | ORAL_TABLET | Freq: Every day | ORAL | 1 refills | Status: DC
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
## 2021-03-08 ENCOUNTER — Other Ambulatory Visit (HOSPITAL_BASED_OUTPATIENT_CLINIC_OR_DEPARTMENT_OTHER): Payer: Self-pay

## 2021-03-12 ENCOUNTER — Other Ambulatory Visit (HOSPITAL_BASED_OUTPATIENT_CLINIC_OR_DEPARTMENT_OTHER): Payer: Self-pay

## 2021-03-12 DIAGNOSIS — Z3009 Encounter for other general counseling and advice on contraception: Secondary | ICD-10-CM | POA: Diagnosis not present

## 2021-03-12 DIAGNOSIS — Z7251 High risk heterosexual behavior: Secondary | ICD-10-CM | POA: Diagnosis not present

## 2021-03-12 DIAGNOSIS — N390 Urinary tract infection, site not specified: Secondary | ICD-10-CM | POA: Diagnosis not present

## 2021-03-12 MED ORDER — NORGESTIMATE-ETH ESTRADIOL 0.25-35 MG-MCG PO TABS
ORAL_TABLET | ORAL | 11 refills | Status: AC
  Filled 2021-03-12: qty 84, 84d supply, fill #0
  Filled 2021-12-29: qty 84, 84d supply, fill #1

## 2021-03-12 MED ORDER — NITROFURANTOIN MONOHYD MACRO 100 MG PO CAPS
ORAL_CAPSULE | ORAL | 0 refills | Status: AC
  Filled 2021-03-12: qty 10, 5d supply, fill #0

## 2021-03-24 ENCOUNTER — Other Ambulatory Visit (HOSPITAL_BASED_OUTPATIENT_CLINIC_OR_DEPARTMENT_OTHER): Payer: Self-pay

## 2021-03-24 MED ORDER — BETAMETHASONE DIPROPIONATE AUG 0.05 % EX CREA
TOPICAL_CREAM | CUTANEOUS | 1 refills | Status: AC
  Filled 2021-03-24: qty 45, 30d supply, fill #0

## 2021-04-08 ENCOUNTER — Other Ambulatory Visit (HOSPITAL_BASED_OUTPATIENT_CLINIC_OR_DEPARTMENT_OTHER): Payer: Self-pay

## 2021-04-08 MED FILL — Budesonide-Formoterol Fumarate Dihyd Aerosol 80-4.5 MCG/ACT: RESPIRATORY_TRACT | 30 days supply | Qty: 10.2 | Fill #0 | Status: AC

## 2021-04-09 ENCOUNTER — Ambulatory Visit (HOSPITAL_COMMUNITY): Payer: 59 | Admitting: Licensed Clinical Social Worker

## 2021-04-09 ENCOUNTER — Other Ambulatory Visit (HOSPITAL_BASED_OUTPATIENT_CLINIC_OR_DEPARTMENT_OTHER): Payer: Self-pay

## 2021-04-09 MED ORDER — TRIAMCINOLONE ACETONIDE 0.1 % EX CREA
TOPICAL_CREAM | CUTANEOUS | 2 refills | Status: AC
  Filled 2021-04-09: qty 15, 7d supply, fill #0
  Filled 2021-05-22: qty 15, 7d supply, fill #1
  Filled 2021-07-15: qty 15, 7d supply, fill #2

## 2021-04-14 ENCOUNTER — Other Ambulatory Visit (HOSPITAL_BASED_OUTPATIENT_CLINIC_OR_DEPARTMENT_OTHER): Payer: Self-pay

## 2021-04-14 MED FILL — Escitalopram Oxalate Tab 10 MG (Base Equiv): ORAL | 30 days supply | Qty: 45 | Fill #0 | Status: AC

## 2021-04-16 ENCOUNTER — Other Ambulatory Visit (HOSPITAL_BASED_OUTPATIENT_CLINIC_OR_DEPARTMENT_OTHER): Payer: Self-pay

## 2021-04-29 ENCOUNTER — Ambulatory Visit (HOSPITAL_COMMUNITY): Payer: 59 | Admitting: Psychiatry

## 2021-05-08 ENCOUNTER — Other Ambulatory Visit (HOSPITAL_BASED_OUTPATIENT_CLINIC_OR_DEPARTMENT_OTHER): Payer: Self-pay

## 2021-05-08 MED ORDER — CARESTART COVID-19 HOME TEST VI KIT
PACK | 0 refills | Status: AC
  Filled 2021-05-08: qty 4, 4d supply, fill #0

## 2021-05-21 ENCOUNTER — Ambulatory Visit (INDEPENDENT_AMBULATORY_CARE_PROVIDER_SITE_OTHER): Payer: 59 | Admitting: Licensed Clinical Social Worker

## 2021-05-21 DIAGNOSIS — F332 Major depressive disorder, recurrent severe without psychotic features: Secondary | ICD-10-CM

## 2021-05-21 DIAGNOSIS — F411 Generalized anxiety disorder: Secondary | ICD-10-CM

## 2021-05-22 ENCOUNTER — Other Ambulatory Visit (HOSPITAL_BASED_OUTPATIENT_CLINIC_OR_DEPARTMENT_OTHER): Payer: Self-pay
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
## 2021-06-02 ENCOUNTER — Other Ambulatory Visit (HOSPITAL_COMMUNITY): Payer: Self-pay | Admitting: Psychiatry

## 2021-06-02 ENCOUNTER — Other Ambulatory Visit (HOSPITAL_BASED_OUTPATIENT_CLINIC_OR_DEPARTMENT_OTHER): Payer: Self-pay

## 2021-06-02 MED ORDER — ESCITALOPRAM OXALATE 10 MG PO TABS
ORAL_TABLET | ORAL | 1 refills | Status: DC
  Filled 2021-06-02: qty 45, 30d supply, fill #0
  Filled 2021-07-28: qty 45, 30d supply, fill #1

## 2021-06-12 ENCOUNTER — Other Ambulatory Visit (HOSPITAL_BASED_OUTPATIENT_CLINIC_OR_DEPARTMENT_OTHER): Payer: Self-pay

## 2021-06-12 MED FILL — Budesonide-Formoterol Fumarate Dihyd Aerosol 80-4.5 MCG/ACT: RESPIRATORY_TRACT | 30 days supply | Qty: 10.2 | Fill #1 | Status: AC

## 2021-07-08 ENCOUNTER — Ambulatory Visit (INDEPENDENT_AMBULATORY_CARE_PROVIDER_SITE_OTHER): Payer: 59 | Admitting: Licensed Clinical Social Worker

## 2021-07-08 DIAGNOSIS — F332 Major depressive disorder, recurrent severe without psychotic features: Secondary | ICD-10-CM | POA: Diagnosis not present

## 2021-07-10 NOTE — Progress Notes (Signed)
   THERAPIST PROGRESS NOTE  Session Time: 3:00  pm-3:45 pm  Type of Therapy: Individual Therapy  Purpose of session: Kittie will manage mood as evidenced by increasing friendships/social connections, increase coping skills, cope with depressive and anxious thoughts, and express emotions appropriately for 5 out of 7 days for 60 days.  Interventions: Therapist utilized CBT and Solution focused brief therapy to address mood. Therapist provided support and empathy to patient during session. Therapist had patient verbalize triggers for anxiety. Therapist worked with patient to identify ways to identify automatic negative thoughts.   Effectiveness: Patient was oriented x5 (person, place, situation, time, and object). Patient was casually dressed, and appropriately groomed. Patient was alert, engaged, pleasant, and cooperative. Patient has been managing mood. She is nervous about starting school. She feels like people her age negative judge her and look at her. She shared an experience where she was in a store and felt like a group of guys were making negative comments or thinking negatively about her. As she reflected, she admitted that they were not paying attention to her and actually polite when the passed by her. She also noted an exception to feeling judged by people her age. She went to orientation and found people that she connected with and got along with. Patient agreed to recognize when these thoughts arise and look for exceptions.  Patient engaged in session. She responded well to interventions. Patient continues to meet criteria for Major depressive disorder, recurrent, severe without psychosis. Patient will continue in outpatient therapy due to being the least restrictive service to meet her needs. Patient made minimal progress on her goals.     Suicidal/Homicidal: Negativewithout intent/plan  Plan: Return again in 1-2 weeks.  Diagnosis: Axis I: Major Depression, Recurrent  severe    Axis II: No diagnosis    Bynum Bellows, LCSW 07/10/2021

## 2021-07-14 DIAGNOSIS — S92511A Displaced fracture of proximal phalanx of right lesser toe(s), initial encounter for closed fracture: Secondary | ICD-10-CM | POA: Diagnosis not present

## 2021-07-15 ENCOUNTER — Other Ambulatory Visit (HOSPITAL_BASED_OUTPATIENT_CLINIC_OR_DEPARTMENT_OTHER): Payer: Self-pay

## 2021-07-21 ENCOUNTER — Ambulatory Visit (HOSPITAL_COMMUNITY): Payer: 59 | Admitting: Licensed Clinical Social Worker

## 2021-07-28 ENCOUNTER — Other Ambulatory Visit (HOSPITAL_BASED_OUTPATIENT_CLINIC_OR_DEPARTMENT_OTHER): Payer: Self-pay

## 2021-08-04 ENCOUNTER — Ambulatory Visit (INDEPENDENT_AMBULATORY_CARE_PROVIDER_SITE_OTHER): Payer: 59 | Admitting: Licensed Clinical Social Worker

## 2021-08-04 DIAGNOSIS — F411 Generalized anxiety disorder: Secondary | ICD-10-CM

## 2021-08-05 NOTE — Progress Notes (Signed)
   THERAPIST PROGRESS NOTE  Session Time: 9:00 am-9:45 am  Type of Therapy: Individual Therapy  Purpose of session: Veronica Long will manage mood as evidenced by increasing friendships/social connections, increase coping skills, cope with depressive and anxious thoughts, and express emotions appropriately for 5 out of 7 days for 60 days.  Interventions: Therapist utilized CBT and Solution focused brief therapy to address mood. \Therapist provided support and empathy to patient during session. Therapist had patient identify pre-session change. Therapist worked with patient to identify her social anxiety safety behaviors that maintain anxiety.   Effectiveness: Patient was oriented x5 (person, place, situation, time, and object). Patient was casually dressed, and appropriately groomed. Patient was alert, engaged, pleasant, and cooperative. Patient noted that school is going well for her. She noted that school is easier than she thought. Patient recognized the thoughts that got her concerned such as "I'll be the dumbest in the class" etc were not realistic thoughts. Patient noted that while she is feeling better with school she has not made any friends. She talks to people but usually cuts the conversations short. She feels like the person is ready to be done with the conversation and let's them out of it. Patient noted also she feels like she doesn't know what to say. Patient did note that when she will ask questions about the other person which keeps the conversation going but she doesn't always do this. Patient was able to recognize the behaviors she engages in that reduce social anxiety in the moment but actually create more anxiety such as: cutting the conversation short, using her phone or a book to look busy, and not maintaining eye contact. Patient feels like others have something about them that draws others to them. After discussion, patient understood that the behaviors she engages in could send the  message that she is shut off, not interested in conversation, or busy. Patient agreed to work on not using a book or phone to look busy.   Patient engaged in session. She responded well to interventions. Patient continues to meet criteria for Major depressive disorder, recurrent, severe without psychosis. Patient will continue in outpatient therapy due to being the least restrictive service to meet her needs. Patient made minimal progress on her goals.    Suicidal/Homicidal: Negativewithout intent/plan  Plan: Return again in 1-2 weeks.  Diagnosis: Axis I: Major Depression, Recurrent severe    Axis II: No diagnosis    Bynum Bellows, LCSW 08/05/2021

## 2021-08-18 ENCOUNTER — Ambulatory Visit (HOSPITAL_COMMUNITY): Payer: 59 | Admitting: Licensed Clinical Social Worker

## 2021-09-01 ENCOUNTER — Ambulatory Visit (HOSPITAL_COMMUNITY): Payer: 59 | Admitting: Licensed Clinical Social Worker

## 2021-09-01 ENCOUNTER — Encounter (HOSPITAL_COMMUNITY): Payer: Self-pay | Admitting: Licensed Clinical Social Worker

## 2021-09-22 ENCOUNTER — Other Ambulatory Visit (HOSPITAL_BASED_OUTPATIENT_CLINIC_OR_DEPARTMENT_OTHER): Payer: Self-pay

## 2021-09-22 ENCOUNTER — Other Ambulatory Visit (HOSPITAL_COMMUNITY): Payer: Self-pay | Admitting: Psychiatry

## 2021-09-24 ENCOUNTER — Telehealth (HOSPITAL_COMMUNITY): Payer: Self-pay | Admitting: Psychiatry

## 2021-09-24 ENCOUNTER — Other Ambulatory Visit (HOSPITAL_BASED_OUTPATIENT_CLINIC_OR_DEPARTMENT_OTHER): Payer: Self-pay

## 2021-09-24 ENCOUNTER — Other Ambulatory Visit (HOSPITAL_COMMUNITY): Payer: Self-pay

## 2021-09-24 MED ORDER — ESCITALOPRAM OXALATE 10 MG PO TABS
ORAL_TABLET | ORAL | 0 refills | Status: DC
  Filled 2021-09-24: qty 45, 30d supply, fill #0

## 2021-09-24 NOTE — Telephone Encounter (Signed)
This pt left a voicemail about needing a refill but she did not say which prescription. I cannot get in contact with her and it will not allow me to leave a voicemail.

## 2021-09-24 NOTE — Telephone Encounter (Signed)
DONE

## 2021-09-24 NOTE — Telephone Encounter (Signed)
No pharmacy was stated but I think the pt needs a refill on escitalopram (LEXAPRO) 10 MG tablet

## 2021-09-24 NOTE — Telephone Encounter (Signed)
  Refill:  escitalopram (LEXAPRO) 10 MG tablet  Send to  [facility]

## 2021-09-25 ENCOUNTER — Other Ambulatory Visit (HOSPITAL_BASED_OUTPATIENT_CLINIC_OR_DEPARTMENT_OTHER): Payer: Self-pay

## 2021-10-01 ENCOUNTER — Other Ambulatory Visit (HOSPITAL_BASED_OUTPATIENT_CLINIC_OR_DEPARTMENT_OTHER): Payer: Self-pay

## 2021-10-01 ENCOUNTER — Encounter (HOSPITAL_COMMUNITY): Payer: Self-pay | Admitting: Psychiatry

## 2021-10-01 ENCOUNTER — Telehealth (INDEPENDENT_AMBULATORY_CARE_PROVIDER_SITE_OTHER): Payer: 59 | Admitting: Psychiatry

## 2021-10-01 DIAGNOSIS — F411 Generalized anxiety disorder: Secondary | ICD-10-CM

## 2021-10-01 DIAGNOSIS — F332 Major depressive disorder, recurrent severe without psychotic features: Secondary | ICD-10-CM

## 2021-10-01 MED ORDER — ESCITALOPRAM OXALATE 10 MG PO TABS
15.0000 mg | ORAL_TABLET | Freq: Every day | ORAL | 1 refills | Status: DC
  Filled 2021-10-01 – 2021-11-13 (×2): qty 45, 30d supply, fill #0
  Filled 2021-12-29: qty 45, 30d supply, fill #1
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
## 2021-10-16 DIAGNOSIS — Z309 Encounter for contraceptive management, unspecified: Secondary | ICD-10-CM | POA: Diagnosis not present

## 2021-10-16 DIAGNOSIS — Z113 Encounter for screening for infections with a predominantly sexual mode of transmission: Secondary | ICD-10-CM | POA: Diagnosis not present

## 2021-10-16 DIAGNOSIS — N939 Abnormal uterine and vaginal bleeding, unspecified: Secondary | ICD-10-CM | POA: Diagnosis not present

## 2021-10-16 DIAGNOSIS — N926 Irregular menstruation, unspecified: Secondary | ICD-10-CM | POA: Diagnosis not present

## 2021-10-16 DIAGNOSIS — Z6826 Body mass index (BMI) 26.0-26.9, adult: Secondary | ICD-10-CM | POA: Diagnosis not present

## 2021-10-16 DIAGNOSIS — R635 Abnormal weight gain: Secondary | ICD-10-CM | POA: Diagnosis not present

## 2021-10-16 DIAGNOSIS — Z01419 Encounter for gynecological examination (general) (routine) without abnormal findings: Secondary | ICD-10-CM | POA: Diagnosis not present

## 2021-11-06 ENCOUNTER — Other Ambulatory Visit (HOSPITAL_BASED_OUTPATIENT_CLINIC_OR_DEPARTMENT_OTHER): Payer: Self-pay

## 2021-11-10 ENCOUNTER — Other Ambulatory Visit (HOSPITAL_BASED_OUTPATIENT_CLINIC_OR_DEPARTMENT_OTHER): Payer: Self-pay

## 2021-11-11 ENCOUNTER — Other Ambulatory Visit (HOSPITAL_BASED_OUTPATIENT_CLINIC_OR_DEPARTMENT_OTHER): Payer: Self-pay

## 2021-11-11 MED ORDER — BUDESONIDE-FORMOTEROL FUMARATE 80-4.5 MCG/ACT IN AERO
2.0000 | INHALATION_SPRAY | Freq: Every day | RESPIRATORY_TRACT | 0 refills | Status: AC
  Filled 2021-11-11 – 2021-11-28 (×2): qty 10.2, 30d supply, fill #0
  Filled 2022-04-09: qty 10.2, 30d supply, fill #1

## 2021-11-11 MED ORDER — TRIAMCINOLONE ACETONIDE 0.1 % EX CREA
TOPICAL_CREAM | CUTANEOUS | 0 refills | Status: AC
  Filled 2021-11-11: qty 60, 90d supply, fill #0

## 2021-11-12 ENCOUNTER — Other Ambulatory Visit (HOSPITAL_BASED_OUTPATIENT_CLINIC_OR_DEPARTMENT_OTHER): Payer: Self-pay

## 2021-11-13 ENCOUNTER — Other Ambulatory Visit (HOSPITAL_BASED_OUTPATIENT_CLINIC_OR_DEPARTMENT_OTHER): Payer: Self-pay

## 2021-11-13 DIAGNOSIS — Z309 Encounter for contraceptive management, unspecified: Secondary | ICD-10-CM | POA: Diagnosis not present

## 2021-11-13 DIAGNOSIS — E282 Polycystic ovarian syndrome: Secondary | ICD-10-CM | POA: Diagnosis not present

## 2021-11-19 ENCOUNTER — Other Ambulatory Visit (HOSPITAL_BASED_OUTPATIENT_CLINIC_OR_DEPARTMENT_OTHER): Payer: Self-pay

## 2021-11-28 ENCOUNTER — Other Ambulatory Visit (HOSPITAL_BASED_OUTPATIENT_CLINIC_OR_DEPARTMENT_OTHER): Payer: Self-pay

## 2021-12-29 ENCOUNTER — Other Ambulatory Visit (HOSPITAL_BASED_OUTPATIENT_CLINIC_OR_DEPARTMENT_OTHER): Payer: Self-pay

## 2022-01-02 ENCOUNTER — Encounter (HOSPITAL_COMMUNITY): Payer: Self-pay | Admitting: Psychiatry

## 2022-01-02 ENCOUNTER — Telehealth (INDEPENDENT_AMBULATORY_CARE_PROVIDER_SITE_OTHER): Payer: 59 | Admitting: Psychiatry

## 2022-01-02 DIAGNOSIS — F5102 Adjustment insomnia: Secondary | ICD-10-CM | POA: Diagnosis not present

## 2022-01-02 DIAGNOSIS — F332 Major depressive disorder, recurrent severe without psychotic features: Secondary | ICD-10-CM | POA: Diagnosis not present

## 2022-01-02 DIAGNOSIS — F411 Generalized anxiety disorder: Secondary | ICD-10-CM
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
## 2022-03-11 ENCOUNTER — Other Ambulatory Visit (HOSPITAL_BASED_OUTPATIENT_CLINIC_OR_DEPARTMENT_OTHER): Payer: Self-pay

## 2022-03-11 ENCOUNTER — Other Ambulatory Visit (HOSPITAL_COMMUNITY): Payer: Self-pay | Admitting: Psychiatry

## 2022-03-11 ENCOUNTER — Encounter: Payer: Self-pay | Admitting: Registered"

## 2022-03-11 ENCOUNTER — Encounter: Payer: 59 | Attending: Obstetrics and Gynecology | Admitting: Registered"

## 2022-03-11 DIAGNOSIS — E282 Polycystic ovarian syndrome: Secondary | ICD-10-CM | POA: Insufficient documentation

## 2022-03-11 NOTE — Patient Instructions (Signed)
Aim to eat balanced meals ?Work on your morning routine to get more protein for your first food intake before class ?Increase physical activity to increase insulin sensitivity. Work up to goal of 3-5x/week, 30-45 min ?

## 2022-03-11 NOTE — Progress Notes (Signed)
Medical Nutrition Therapy  ?Appointment Start time:  1445  Appointment End time:  1545 ? ?Primary concerns today: PCOS  ?Referral diagnosis: PCOS ?Preferred learning style: no preference indicated ?Learning readiness: ready ? ? ?NUTRITION ASSESSMENT  ? ?Anthropometrics  ?Not assessed  ? ?Clinical ?Medical Hx: reviewed PCOS new dx end of 2022 ?Medications: reviewed ?Labs: reviewed ?Notable Signs/Symptoms:irregular period, menarche 21, at 21 acne that was treated with OCP and regulated period. And when stopped OCP irrgular periods.  ? ?Lifestyle & Dietary Hx ?Pt states she was skinny in middle school/high school and states kids would ask if she was anorexic. Pt states she did not have eating disorder, but was naturally thin. Pt states starting ~21 yrs old started gaining weight.  ? ?Pt states she is trying to quit vaping to help reduce asthma. ? ?Physical activity: Pt states she walks on campus and takes stairs. Pt reports that she used to go to the [place] pool regularly when her school schedule worked out. ? ?Pt reports she likes some vegetables, celetry with cream cheese, sometimes crunch salad, vegetables in dishes like fajitas but not by themselves. Pt states her fruit intake is mostly in juice form, but does eat some whole fruits, berries, mangos and bananas. ? ?Pt states she doesn't eat a lot of meat, usually only in one meal per day, sometimes has sweet cravings at night. ? ?Estimated daily fluid intake: 60 oz (not much water) ?Supplements: none ?Sleep: 5-6 hrs per night, anxiety at night, naps during day. On weekends gets great sleep ?Stress / self-care: clean environment, reading, paint, gardening and being outside ?Current average weekly physical activity: ADL ? ?24-Hr Dietary Recall ?First Meal: cereal (oatmeal, brown sugar) or 3x/week chick fil a chicken biscuit with coffee or sweet tea ?Snack: tea and/or juice throughout the day ?Second Meal:  ?Snack:  ?Third Meal: tacos, meat cheese, olives, sour cream,  juice ?Snack:  ?Beverages: 5-6 c juice, 2-3 c green tea, honey with a lot of ice, iced coffee 4 c/week, no alcohol ? ?NUTRITION DIAGNOSIS  ?NB-1.1 Food and nutrition-related knowledge deficit As related to how food intake is related to PCOS and role of protein, especially for breakfast.  As evidenced by dietary recall and stated new knowledge gained in visit. ? ? ?NUTRITION INTERVENTION  ?Nutrition education (E-1) on the following topics:  ?Insulin role in PCOS and blood sugar control ?Inositol role in reducing PCOS symptoms ?Exercise to help insulin sensitivity ?Reasons not to follow keto with PCOS ? ?Handouts Provided Include  ?PCOS: A Guide for Parents [facility]) ?Inositol and PCOS ?MyPlate ([employer]) ? ?Learning Style & Readiness for Change ?Teaching method utilized: Visual & Auditory  ?Demonstrated degree of understanding via: Teach Back  ?Barriers to learning/adherence to lifestyle change: none ? ?Goals Established by Pt ?Aim to eat balanced meals ?Work on your morning routine to get more protein for your first food intake before class ?Increase physical activity to increase insulin sensitivity. Work up to goal of 3-5x/week, 30-45 min ? ? ?MONITORING & EVALUATION ?Dietary intake, weekly physical activity, and PCOS symptoms prn. ?

## 2022-03-12 ENCOUNTER — Other Ambulatory Visit (HOSPITAL_BASED_OUTPATIENT_CLINIC_OR_DEPARTMENT_OTHER): Payer: Self-pay

## 2022-03-12 MED ORDER — ESCITALOPRAM OXALATE 10 MG PO TABS
15.0000 mg | ORAL_TABLET | Freq: Every day | ORAL | 0 refills | Status: DC
  Filled 2022-03-12: qty 45, 30d supply, fill #0

## 2022-04-09 ENCOUNTER — Other Ambulatory Visit (HOSPITAL_BASED_OUTPATIENT_CLINIC_OR_DEPARTMENT_OTHER): Payer: Self-pay

## 2022-04-14 ENCOUNTER — Encounter (HOSPITAL_COMMUNITY): Payer: Self-pay

## 2022-04-14 ENCOUNTER — Telehealth (HOSPITAL_COMMUNITY): Payer: 59 | Admitting: Psychiatry

## 2022-04-27 ENCOUNTER — Encounter (HOSPITAL_COMMUNITY): Payer: Self-pay | Admitting: Psychiatry

## 2022-04-27 ENCOUNTER — Telehealth (INDEPENDENT_AMBULATORY_CARE_PROVIDER_SITE_OTHER): Payer: 59 | Admitting: Psychiatry

## 2022-04-27 ENCOUNTER — Other Ambulatory Visit (HOSPITAL_BASED_OUTPATIENT_CLINIC_OR_DEPARTMENT_OTHER): Payer: Self-pay

## 2022-04-27 DIAGNOSIS — F411 Generalized anxiety disorder: Secondary | ICD-10-CM | POA: Diagnosis not present

## 2022-04-27 DIAGNOSIS — F332 Major depressive disorder, recurrent severe without psychotic features: Secondary | ICD-10-CM | POA: Diagnosis not present

## 2022-04-27 DIAGNOSIS — F5102 Adjustment insomnia: Secondary | ICD-10-CM | POA: Diagnosis not present

## 2022-04-27 MED ORDER — ESCITALOPRAM OXALATE 10 MG PO TABS
15.0000 mg | ORAL_TABLET | Freq: Every day | ORAL | 2 refills | Status: DC
  Filled 2022-04-27 – 2022-05-14 (×2): qty 45, 30d supply, fill #0
  Filled 2022-07-16: qty 45, 30d supply, fill #1
  Filled 2022-09-08: qty 45, 30d supply, fill #2
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
## 2022-04-28 ENCOUNTER — Other Ambulatory Visit (HOSPITAL_BASED_OUTPATIENT_CLINIC_OR_DEPARTMENT_OTHER): Payer: Self-pay

## 2022-05-05 ENCOUNTER — Other Ambulatory Visit (HOSPITAL_BASED_OUTPATIENT_CLINIC_OR_DEPARTMENT_OTHER): Payer: Self-pay

## 2022-05-08 ENCOUNTER — Other Ambulatory Visit (HOSPITAL_BASED_OUTPATIENT_CLINIC_OR_DEPARTMENT_OTHER): Payer: Self-pay

## 2022-05-08 DIAGNOSIS — J309 Allergic rhinitis, unspecified: Secondary | ICD-10-CM | POA: Diagnosis not present

## 2022-05-08 DIAGNOSIS — J4540 Moderate persistent asthma, uncomplicated: Secondary | ICD-10-CM | POA: Diagnosis not present

## 2022-05-08 MED ORDER — BETAMETHASONE DIPROPIONATE 0.05 % EX CREA
TOPICAL_CREAM | CUTANEOUS | 2 refills | Status: AC
  Filled 2022-05-08: qty 60, 30d supply, fill #0
  Filled 2022-08-20: qty 60, 30d supply, fill #1

## 2022-05-08 MED ORDER — FLUTICASONE PROPIONATE 50 MCG/ACT NA SUSP
NASAL | 6 refills | Status: AC
  Filled 2022-05-08: qty 16, 60d supply, fill #0

## 2022-05-08 MED ORDER — TRIAMCINOLONE ACETONIDE 0.1 % EX CREA
TOPICAL_CREAM | CUTANEOUS | 2 refills | Status: AC
  Filled 2022-05-08: qty 60, 90d supply, fill #0

## 2022-05-14 ENCOUNTER — Other Ambulatory Visit (HOSPITAL_BASED_OUTPATIENT_CLINIC_OR_DEPARTMENT_OTHER): Payer: Self-pay

## 2022-07-16 ENCOUNTER — Other Ambulatory Visit (HOSPITAL_BASED_OUTPATIENT_CLINIC_OR_DEPARTMENT_OTHER): Payer: Self-pay

## 2022-08-20 ENCOUNTER — Other Ambulatory Visit (HOSPITAL_BASED_OUTPATIENT_CLINIC_OR_DEPARTMENT_OTHER): Payer: Self-pay

## 2022-08-21 ENCOUNTER — Encounter (HOSPITAL_COMMUNITY): Payer: Self-pay | Admitting: Psychiatry

## 2022-08-21 ENCOUNTER — Telehealth (HOSPITAL_COMMUNITY): Payer: 59 | Admitting: Psychiatry

## 2022-08-21 ENCOUNTER — Encounter (HOSPITAL_COMMUNITY): Payer: Self-pay

## 2022-09-08 ENCOUNTER — Other Ambulatory Visit (HOSPITAL_BASED_OUTPATIENT_CLINIC_OR_DEPARTMENT_OTHER): Payer: Self-pay

## 2022-10-08 ENCOUNTER — Emergency Department (HOSPITAL_BASED_OUTPATIENT_CLINIC_OR_DEPARTMENT_OTHER): Payer: 59

## 2022-10-08 ENCOUNTER — Encounter (HOSPITAL_BASED_OUTPATIENT_CLINIC_OR_DEPARTMENT_OTHER): Payer: Self-pay | Admitting: Emergency Medicine

## 2022-10-08 DIAGNOSIS — J029 Acute pharyngitis, unspecified: Secondary | ICD-10-CM | POA: Diagnosis present

## 2022-10-08 DIAGNOSIS — R059 Cough, unspecified: Secondary | ICD-10-CM | POA: Diagnosis not present

## 2022-10-08 DIAGNOSIS — J4521 Mild intermittent asthma with (acute) exacerbation: Secondary | ICD-10-CM | POA: Diagnosis not present

## 2022-10-08 DIAGNOSIS — R0602 Shortness of breath: Secondary | ICD-10-CM | POA: Diagnosis not present

## 2022-10-08 DIAGNOSIS — B349 Viral infection, unspecified: Secondary | ICD-10-CM | POA: Insufficient documentation

## 2022-10-08 DIAGNOSIS — Z1152 Encounter for screening for COVID-19: Secondary | ICD-10-CM | POA: Diagnosis not present

## 2022-10-08 DIAGNOSIS — Z7951 Long term (current) use of inhaled steroids: Secondary | ICD-10-CM | POA: Diagnosis not present

## 2022-10-08 MED ORDER — IPRATROPIUM-ALBUTEROL 0.5-2.5 (3) MG/3ML IN SOLN
3.0000 mL | Freq: Once | RESPIRATORY_TRACT | Status: AC
  Administered 2022-10-08: 3 mL via RESPIRATORY_TRACT
  Filled 2022-10-08: qty 3

## 2022-10-08 MED ORDER — ACETAMINOPHEN 500 MG PO TABS
1000.0000 mg | ORAL_TABLET | Freq: Once | ORAL | Status: AC
  Administered 2022-10-09: 1000 mg via ORAL
  Filled 2022-10-08: qty 2

## 2022-10-08 MED ORDER — ALBUTEROL SULFATE (2.5 MG/3ML) 0.083% IN NEBU
2.5000 mg | INHALATION_SOLUTION | Freq: Once | RESPIRATORY_TRACT | Status: AC
  Administered 2022-10-08: 2.5 mg via RESPIRATORY_TRACT
  Filled 2022-10-08: qty 3

## 2022-10-08 NOTE — ED Triage Notes (Signed)
Pt reports smoking marijuana 2 days ago. After started having a sore throat. Today having shob, chills, productive cough, and generalized body aches. Pt reports using her albuterol inhaler up to 10 times today with no relief. Hx of asthma. Uses vape some days.

## 2022-10-09 ENCOUNTER — Emergency Department (HOSPITAL_BASED_OUTPATIENT_CLINIC_OR_DEPARTMENT_OTHER)
Admission: EM | Admit: 2022-10-09 | Discharge: 2022-10-09 | Disposition: A | Discharge status: Home / Self Care | Payer: 59 | Attending: Emergency Medicine | Admitting: Emergency Medicine

## 2022-10-09 ENCOUNTER — Other Ambulatory Visit (HOSPITAL_BASED_OUTPATIENT_CLINIC_OR_DEPARTMENT_OTHER): Payer: Self-pay

## 2022-10-09 DIAGNOSIS — B349 Viral infection, unspecified: Secondary | ICD-10-CM

## 2022-10-09 DIAGNOSIS — J4521 Mild intermittent asthma with (acute) exacerbation: Secondary | ICD-10-CM

## 2022-10-09 LAB — RESP PANEL BY RT-PCR (FLU A&B, COVID) ARPGX2
Influenza A by PCR: NEGATIVE
Influenza B by PCR: NEGATIVE
SARS Coronavirus 2 by RT PCR: NEGATIVE

## 2022-10-09 MED ORDER — BENZONATATE 100 MG PO CAPS
100.0000 mg | ORAL_CAPSULE | Freq: Three times a day (TID) | ORAL | 0 refills | Status: AC
  Filled 2022-10-09: qty 21, 7d supply, fill #0

## 2022-10-09 MED ORDER — BUDESONIDE-FORMOTEROL FUMARATE 80-4.5 MCG/ACT IN AERO
2.0000 | INHALATION_SPRAY | Freq: Two times a day (BID) | RESPIRATORY_TRACT | 0 refills | Status: DC
  Filled 2022-10-09: qty 10.2, 30d supply, fill #0

## 2022-10-09 MED ORDER — ALBUTEROL SULFATE HFA 108 (90 BASE) MCG/ACT IN AERS
1.0000 | INHALATION_SPRAY | Freq: Four times a day (QID) | RESPIRATORY_TRACT | 0 refills | Status: AC | PRN
  Filled 2022-10-09: qty 6.7, 25d supply, fill #0

## 2022-10-09 NOTE — ED Provider Notes (Signed)
[facility] EMERGENCY DEPARTMENT Provider Note   CSN: [id number] Arrival date & time: 10/08/22  2259     History  Chief Complaint  Patient presents with   Sore Throat    Veronica Long is a 21 y.o. female.  The history is provided by the patient and a parent.  Sore Throat This is a new problem. The current episode started 2 days ago. The problem occurs constantly. Pertinent negatives include no chest pain, no abdominal pain and no headaches. Associated symptoms comments: Cough and congestion, fever and scratchy throat and now wheezing since using a bowl of marijuana.  Has been out of her symbicort . Nothing aggravates the symptoms. Nothing relieves the symptoms. She has tried nothing for the symptoms. The treatment provided no relief.       Home Medications Prior to Admission medications   Medication Sig Start Date End Date Taking? Authorizing Provider  albuterol (VENTOLIN HFA) 108 (90 Base) MCG/ACT inhaler Inhale 1-2 puffs into the lungs every 6 (six) hours as needed for wheezing or shortness of breath. 10/09/22  Yes Palumbo, April, MD  benzonatate (TESSALON) 100 MG capsule Take 1 capsule (100 mg total) by mouth every 8 (eight) hours. 10/09/22  Yes Palumbo, April, MD  budesonide-formoterol [facility]) 80-4.5 MCG/ACT inhaler Inhale 2 puffs into the lungs in the morning and at bedtime. 10/09/22  Yes Palumbo, April, MD  albuterol (VENTOLIN HFA) 108 (90 Base) MCG/ACT inhaler Inhale 2 puffs into the lungs every 4 (four) hours as needed for wheezing or shortness of breath.    [provider]  augmented betamethasone dipropionate (DIPROLENE-AF) 0.05 % cream Apply topically to affected areas of hands 2 times a day as needed *Do not apply to face, groin, or underarms* 07/16/20   Allyn Kenner, MD  betamethasone dipropionate 0.05 % cream Apply 1 application externally once a day 05/08/22     budesonide-formoterol (SYMBICORT) 80-4.5 MCG/ACT inhaler INHALE 2 PUFFS BY MOUTH ONCE A  DAY 11/22/20 11/22/21  Glenis Smoker, MD  budesonide-formoterol (SYMBICORT) 80-4.5 MCG/ACT inhaler INHALE 2 PUFFS INTO THE LUNGS ONCE DAILY 03/21/20 03/21/21  Glenis Smoker, MD  budesonide-formoterol (SYMBICORT) 80-4.5 MCG/ACT inhaler Inhale 2 puffs into the lungs daily. 11/11/21     clindamycin (CLEOCIN T) 1 % lotion Apply 1 application topically 2 (two) times daily. 12/28/17   [provider]  COVID-19 At Home Antigen Test [facility] COVID-19 HOME TEST) KIT Use as directed within package instructions 05/08/21   Clementeen Graham, RPH  escitalopram (LEXAPRO) 10 MG tablet Take 1.5 tablets (15 mg total) by mouth daily. 04/27/22 04/27/23  Merian Capron, MD  fluticasone [facility]) 50 MCG/ACT nasal spray PLACE 1 SPRAY IN [facility] NOSTRIL ONCE A DAY 02/18/21 02/18/22  Glenis Smoker, MD  fluticasone [facility]) 50 MCG/ACT nasal spray Place 1 spray in each nostril once a day 05/08/22     minocycline (MINOCIN,DYNACIN) 100 MG capsule Take 100 mg by mouth 2 (two) times daily. 12/28/17   [provider]  [place] 0.25-35 MG-MCG tablet Take 1 tablet by mouth at bedtime.  03/29/17   [provider]  MYORISAN 40 MG capsule  10/26/18   [provider]  nitrofurantoin, macrocrystal-monohydrate, (MACROBID) 100 MG capsule take 1 capsule by mouth twice a day with food for 5 days 03/12/21     norgestimate-ethinyl estradiol (ORTHO-CYCLEN) 0.25-35 MG-MCG tablet take 1 tablet by mouth daily 03/12/21     predniSONE (STERAPRED UNI-PAK 21 TAB) 10 MG (21) TBPK tablet Take 6 tabs for 2  days, then 5 for 2 days, then 4 for 2 days, then 3 for 2 days, 2 for 2 days, then 1 for 2 days 11/04/19   Isla Pence, MD  triamcinolone cream (KENALOG) 0.1 % Apply one application twice daily as needed 04/09/21     triamcinolone cream (KENALOG) 0.1 % 1 application topical twice a day as needed 90 days 11/11/21     triamcinolone cream (KENALOG) 0.1 % Apply 1 application topically twice a day as needed  05/08/22     traZODone (DESYREL) 50 MG tablet Take 1 tablet (50 mg total) by mouth at bedtime. Take half to one at night. Patient not taking: Reported on 11/08/2018 10/05/18 11/08/18  Merian Capron, MD      Allergies    Amoxicillin    Review of Systems   Review of Systems  Constitutional:  Positive for fever.  HENT:  Positive for congestion.   Eyes:  Negative for redness.  Respiratory:  Positive for cough and wheezing.   Cardiovascular:  Negative for chest pain.  Gastrointestinal:  Negative for abdominal pain.  Neurological:  Negative for headaches.  All other systems reviewed and are negative.   Physical Exam Updated Vital Signs BP 127/63 (BP Location: Right Arm)   Pulse (!) 105   Temp 99.3 F (37.4 C) (Oral)   Resp 20   Ht 5' 8.5" (1.74 m)   Wt 66.7 kg   LMP 09/15/2022 (Approximate)   SpO2 95%   BMI 22.03 kg/m  Physical Exam Vitals and nursing note reviewed.  Constitutional:      General: She is not in acute distress.    Appearance: She is well-developed.  HENT:     Head: Normocephalic and atraumatic.     Nose: No rhinorrhea.  Eyes:     Pupils: Pupils are equal, round, and reactive to light.  Cardiovascular:     Rate and Rhythm: Normal rate and regular rhythm.     Pulses: Normal pulses.     Heart sounds: Normal heart sounds.  Pulmonary:     Effort: Pulmonary effort is normal. No respiratory distress.     Breath sounds: Normal breath sounds.  Abdominal:     General: Bowel sounds are normal. There is no distension.     Palpations: Abdomen is soft.     Tenderness: There is no abdominal tenderness. There is no guarding or rebound.  Genitourinary:    Vagina: No vaginal discharge.  Musculoskeletal:        General: Normal range of motion.     Cervical back: Normal range of motion and neck supple. No rigidity.  Lymphadenopathy:     Cervical: No cervical adenopathy.  Skin:    General: Skin is warm and dry.     Capillary Refill: Capillary refill takes less than 2  seconds.     Findings: No erythema or rash.  Neurological:     General: No focal deficit present.     Deep Tendon Reflexes: Reflexes normal.  Psychiatric:        Mood and Affect: Mood normal.     ED Results / Procedures / Treatments   Labs (all labs ordered are listed, but only abnormal results are displayed) Labs Reviewed  RESP PANEL BY RT-PCR (FLU A&B, COVID) [id number]    EKG None  Radiology DG Chest 2 View  Result Date: 10/08/2022 CLINICAL DATA:  Cough short of breath EXAM: CHEST - 2 VIEW COMPARISON:  02/03/2020 FINDINGS: The heart size and mediastinal contours are within normal limits.  Both lungs are clear. The visualized skeletal structures are unremarkable. IMPRESSION: No active cardiopulmonary disease. Electronically Signed   By: Donavan Foil M.D.   On: 10/08/2022 23:40    Procedures Procedures    Medications Ordered in ED Medications  albuterol (PROVENTIL) (2.5 MG/3ML) 0.083% nebulizer solution 2.5 mg (2.5 mg Nebulization Given 10/08/22 2336)  ipratropium-albuterol (DUONEB) 0.5-2.5 (3) MG/3ML nebulizer solution 3 mL (3 mLs Nebulization Given 10/08/22 2336)  acetaminophen (TYLENOL) tablet 1,000 mg (1,000 mg Oral Given 10/09/22 0004)    ED Course/ Medical Decision Making/ A&P                           Medical Decision Making Patient with fever, cough, congestion, scratchy throat and body aches   Amount and/or Complexity of Data Reviewed Independent Historian: parent    Details: See above  External Data Reviewed: notes.    Details: Previous notes reviewed  Labs: ordered.    Details: Negative covid and flu  Radiology: ordered and independent interpretation performed.    Details: No PNA by me   Risk OTC drugs. Prescription drug management. Risk Details: Viral illness.  Will prescribe tessalon for cough. Alternate tylenol and ibuprofen for fever.  Will refill albuterol and symbicort.  I have advised against smoking any substances.  No vaping.  Stable for discharge.       Final Clinical Impression(s) / ED Diagnoses Final diagnoses:  Viral illness  Mild intermittent asthma with exacerbation   Return for intractable cough, coughing up blood, fevers > 100.4 unrelieved by medication, shortness of breath, intractable vomiting, chest pain, shortness of breath, weakness, numbness, changes in speech, facial asymmetry, abdominal pain, passing out, Inability to tolerate liquids or food, cough, altered mental status or any concerns. No signs of systemic illness or infection. The patient is nontoxic-appearing on exam and vital signs are within normal limits.  I have reviewed the triage vital signs and the nursing notes. Pertinent labs & imaging results that were available during my care of the patient were reviewed by me and considered in my medical decision making (see chart for details). After history, exam, and medical workup I feel the patient has been appropriately medically screened and is safe for discharge home. Pertinent diagnoses were discussed with the patient. Patient was given return precautions.    Rx / DC Orders ED Discharge Orders          Ordered    benzonatate (TESSALON) 100 MG capsule  Every 8 hours        10/09/22 0217    albuterol (VENTOLIN HFA) 108 (90 Base) MCG/ACT inhaler  Every 6 hours PRN        10/09/22 0217    budesonide-formoterol (SYMBICORT) 80-4.5 MCG/ACT inhaler  2 times daily        10/09/22 0217              Riaz Onorato, MD 10/09/22 0221

## 2022-10-21 ENCOUNTER — Other Ambulatory Visit (HOSPITAL_BASED_OUTPATIENT_CLINIC_OR_DEPARTMENT_OTHER): Payer: Self-pay

## 2022-10-21 MED ORDER — BETAMETHASONE DIPROPIONATE 0.05 % EX CREA
1.0000 "application " | TOPICAL_CREAM | Freq: Every day | CUTANEOUS | 2 refills | Status: AC
  Filled 2022-10-21: qty 60, 60d supply, fill #0

## 2022-10-27 ENCOUNTER — Other Ambulatory Visit (HOSPITAL_BASED_OUTPATIENT_CLINIC_OR_DEPARTMENT_OTHER): Payer: Self-pay

## 2022-10-27 DIAGNOSIS — F331 Major depressive disorder, recurrent, moderate: Secondary | ICD-10-CM | POA: Diagnosis not present

## 2022-10-27 DIAGNOSIS — L301 Dyshidrosis [pompholyx]: Secondary | ICD-10-CM | POA: Diagnosis not present

## 2022-10-27 DIAGNOSIS — L03115 Cellulitis of right lower limb: Secondary | ICD-10-CM | POA: Diagnosis not present

## 2022-10-27 MED ORDER — ESCITALOPRAM OXALATE 10 MG PO TABS
15.0000 mg | ORAL_TABLET | Freq: Every day | ORAL | 3 refills | Status: DC
  Filled 2022-10-27: qty 135, 90d supply, fill #0
  Filled 2023-05-10: qty 135, 90d supply, fill #1
  Filled 2023-10-22: qty 135, 90d supply, fill #2

## 2022-10-27 MED ORDER — BETAMETHASONE DIPROPIONATE 0.05 % EX CREA
TOPICAL_CREAM | CUTANEOUS | 2 refills | Status: AC
  Filled 2022-10-27: qty 60, 30d supply, fill #0

## 2022-10-27 MED ORDER — DOXYCYCLINE MONOHYDRATE 100 MG PO TABS
100.0000 mg | ORAL_TABLET | Freq: Two times a day (BID) | ORAL | 0 refills | Status: AC
  Filled 2022-10-27: qty 10, 5d supply, fill #0

## 2022-12-16 ENCOUNTER — Other Ambulatory Visit (HOSPITAL_BASED_OUTPATIENT_CLINIC_OR_DEPARTMENT_OTHER): Payer: Self-pay

## 2022-12-16 MED ORDER — BETAMETHASONE DIPROPIONATE 0.05 % EX CREA
TOPICAL_CREAM | CUTANEOUS | 2 refills | Status: AC
  Filled 2022-12-16: qty 60, 30d supply, fill #0

## 2022-12-17 ENCOUNTER — Other Ambulatory Visit (HOSPITAL_BASED_OUTPATIENT_CLINIC_OR_DEPARTMENT_OTHER): Payer: Self-pay

## 2023-01-06 ENCOUNTER — Other Ambulatory Visit (HOSPITAL_BASED_OUTPATIENT_CLINIC_OR_DEPARTMENT_OTHER): Payer: Self-pay

## 2023-01-06 MED ORDER — TRIAMCINOLONE ACETONIDE 0.1 % EX CREA
1.0000 | TOPICAL_CREAM | Freq: Two times a day (BID) | CUTANEOUS | 2 refills | Status: AC | PRN
  Filled 2023-01-06: qty 60, 30d supply, fill #0

## 2023-01-06 MED ORDER — BUDESONIDE-FORMOTEROL FUMARATE 80-4.5 MCG/ACT IN AERO
2.0000 | INHALATION_SPRAY | Freq: Every day | RESPIRATORY_TRACT | 0 refills | Status: AC
  Filled 2023-01-06: qty 20.4, 60d supply, fill #0

## 2023-02-01 ENCOUNTER — Other Ambulatory Visit (HOSPITAL_BASED_OUTPATIENT_CLINIC_OR_DEPARTMENT_OTHER): Payer: Self-pay

## 2023-02-01 DIAGNOSIS — Z23 Encounter for immunization: Secondary | ICD-10-CM | POA: Diagnosis not present

## 2023-02-01 DIAGNOSIS — L301 Dyshidrosis [pompholyx]: Secondary | ICD-10-CM | POA: Diagnosis not present

## 2023-02-01 DIAGNOSIS — F331 Major depressive disorder, recurrent, moderate: Secondary | ICD-10-CM | POA: Diagnosis not present

## 2023-02-01 MED ORDER — BETAMETHASONE DIPROPIONATE 0.05 % EX CREA
1.0000 | TOPICAL_CREAM | Freq: Every day | CUTANEOUS | 2 refills | Status: AC
  Filled 2023-02-01: qty 60, 30d supply, fill #0

## 2023-02-10 ENCOUNTER — Other Ambulatory Visit (HOSPITAL_BASED_OUTPATIENT_CLINIC_OR_DEPARTMENT_OTHER): Payer: Self-pay

## 2023-02-12 ENCOUNTER — Other Ambulatory Visit (HOSPITAL_BASED_OUTPATIENT_CLINIC_OR_DEPARTMENT_OTHER): Payer: Self-pay

## 2023-02-19 ENCOUNTER — Other Ambulatory Visit (HOSPITAL_BASED_OUTPATIENT_CLINIC_OR_DEPARTMENT_OTHER): Payer: Self-pay

## 2023-02-22 ENCOUNTER — Other Ambulatory Visit (HOSPITAL_BASED_OUTPATIENT_CLINIC_OR_DEPARTMENT_OTHER): Payer: Self-pay

## 2023-02-24 ENCOUNTER — Other Ambulatory Visit (HOSPITAL_BASED_OUTPATIENT_CLINIC_OR_DEPARTMENT_OTHER): Payer: Self-pay

## 2023-03-03 ENCOUNTER — Other Ambulatory Visit (HOSPITAL_BASED_OUTPATIENT_CLINIC_OR_DEPARTMENT_OTHER): Payer: Self-pay

## 2023-05-10 ENCOUNTER — Other Ambulatory Visit (HOSPITAL_BASED_OUTPATIENT_CLINIC_OR_DEPARTMENT_OTHER): Payer: Self-pay

## 2023-05-13 DIAGNOSIS — Z0000 Encounter for general adult medical examination without abnormal findings: Secondary | ICD-10-CM | POA: Diagnosis not present

## 2023-05-13 DIAGNOSIS — F331 Major depressive disorder, recurrent, moderate: Secondary | ICD-10-CM | POA: Diagnosis not present

## 2023-05-13 DIAGNOSIS — Z79899 Other long term (current) drug therapy: Secondary | ICD-10-CM | POA: Diagnosis not present

## 2023-07-16 ENCOUNTER — Other Ambulatory Visit (HOSPITAL_BASED_OUTPATIENT_CLINIC_OR_DEPARTMENT_OTHER): Payer: Self-pay

## 2023-07-16 DIAGNOSIS — G479 Sleep disorder, unspecified: Secondary | ICD-10-CM | POA: Diagnosis not present

## 2023-07-16 DIAGNOSIS — F411 Generalized anxiety disorder: Secondary | ICD-10-CM | POA: Diagnosis not present

## 2023-07-16 MED ORDER — ZOLPIDEM TARTRATE 5 MG PO TABS
2.5000 mg | ORAL_TABLET | Freq: Every evening | ORAL | 0 refills | Status: AC
  Filled 2023-07-16: qty 5, 10d supply, fill #0

## 2023-08-03 DIAGNOSIS — Z03818 Encounter for observation for suspected exposure to other biological agents ruled out: Secondary | ICD-10-CM | POA: Diagnosis not present

## 2023-08-03 DIAGNOSIS — J069 Acute upper respiratory infection, unspecified: Secondary | ICD-10-CM | POA: Diagnosis not present

## 2023-08-03 DIAGNOSIS — R5383 Other fatigue: Secondary | ICD-10-CM | POA: Diagnosis not present

## 2023-08-03 DIAGNOSIS — R0981 Nasal congestion: Secondary | ICD-10-CM | POA: Diagnosis not present

## 2023-09-10 ENCOUNTER — Other Ambulatory Visit (HOSPITAL_BASED_OUTPATIENT_CLINIC_OR_DEPARTMENT_OTHER): Payer: Self-pay

## 2023-09-10 MED ORDER — TRIAMCINOLONE ACETONIDE 0.5 % EX CREA
TOPICAL_CREAM | CUTANEOUS | 1 refills | Status: AC
  Filled 2023-09-10: qty 60, 30d supply, fill #0

## 2023-10-06 ENCOUNTER — Other Ambulatory Visit (HOSPITAL_BASED_OUTPATIENT_CLINIC_OR_DEPARTMENT_OTHER): Payer: Self-pay

## 2023-10-06 DIAGNOSIS — M79645 Pain in left finger(s): Secondary | ICD-10-CM | POA: Diagnosis not present

## 2023-10-06 MED ORDER — DOXYCYCLINE HYCLATE 100 MG PO CAPS
100.0000 mg | ORAL_CAPSULE | Freq: Two times a day (BID) | ORAL | 0 refills | Status: DC
  Filled 2023-10-06: qty 28, 14d supply, fill #0

## 2023-10-13 DIAGNOSIS — M79645 Pain in left finger(s): Secondary | ICD-10-CM | POA: Diagnosis not present

## 2023-10-14 DIAGNOSIS — M79645 Pain in left finger(s): Secondary | ICD-10-CM | POA: Diagnosis not present

## 2023-10-22 ENCOUNTER — Other Ambulatory Visit (HOSPITAL_BASED_OUTPATIENT_CLINIC_OR_DEPARTMENT_OTHER): Payer: Self-pay

## 2023-10-22 MED ORDER — DOXYCYCLINE HYCLATE 100 MG PO CAPS
100.0000 mg | ORAL_CAPSULE | Freq: Two times a day (BID) | ORAL | 0 refills | Status: AC
  Filled 2023-10-22 – 2023-10-23 (×2): qty 14, 7d supply, fill #0

## 2023-10-23 ENCOUNTER — Other Ambulatory Visit (HOSPITAL_BASED_OUTPATIENT_CLINIC_OR_DEPARTMENT_OTHER): Payer: Self-pay

## 2023-10-23 ENCOUNTER — Other Ambulatory Visit (HOSPITAL_COMMUNITY): Payer: Self-pay

## 2023-10-25 ENCOUNTER — Other Ambulatory Visit (HOSPITAL_BASED_OUTPATIENT_CLINIC_OR_DEPARTMENT_OTHER): Payer: Self-pay

## 2023-11-12 DIAGNOSIS — Z3009 Encounter for other general counseling and advice on contraception: Secondary | ICD-10-CM | POA: Diagnosis not present

## 2023-11-12 DIAGNOSIS — J4540 Moderate persistent asthma, uncomplicated: Secondary | ICD-10-CM | POA: Diagnosis not present

## 2023-11-12 DIAGNOSIS — F331 Major depressive disorder, recurrent, moderate: Secondary | ICD-10-CM | POA: Diagnosis not present

## 2023-11-12 DIAGNOSIS — M79642 Pain in left hand: Secondary | ICD-10-CM | POA: Diagnosis not present

## 2023-11-23 ENCOUNTER — Other Ambulatory Visit (HOSPITAL_BASED_OUTPATIENT_CLINIC_OR_DEPARTMENT_OTHER): Payer: Self-pay

## 2023-11-23 MED ORDER — BUDESONIDE-FORMOTEROL FUMARATE 80-4.5 MCG/ACT IN AERO
2.0000 | INHALATION_SPRAY | Freq: Every day | RESPIRATORY_TRACT | 0 refills | Status: DC
  Filled 2023-11-23: qty 10.2, 30d supply, fill #0
  Filled 2024-08-25: qty 10.2, 60d supply, fill #1

## 2023-11-26 ENCOUNTER — Other Ambulatory Visit (HOSPITAL_BASED_OUTPATIENT_CLINIC_OR_DEPARTMENT_OTHER): Payer: Self-pay

## 2024-03-15 DIAGNOSIS — Z30017 Encounter for initial prescription of implantable subdermal contraceptive: Secondary | ICD-10-CM | POA: Diagnosis not present

## 2024-03-15 DIAGNOSIS — Z3202 Encounter for pregnancy test, result negative: Secondary | ICD-10-CM | POA: Diagnosis not present

## 2024-03-25 DIAGNOSIS — W5501XA Bitten by cat, initial encounter: Secondary | ICD-10-CM | POA: Diagnosis not present

## 2024-03-25 DIAGNOSIS — S61233A Puncture wound without foreign body of left middle finger without damage to nail, initial encounter: Secondary | ICD-10-CM | POA: Diagnosis not present

## 2024-04-14 ENCOUNTER — Other Ambulatory Visit (HOSPITAL_BASED_OUTPATIENT_CLINIC_OR_DEPARTMENT_OTHER): Payer: Self-pay

## 2024-04-14 DIAGNOSIS — S61233D Puncture wound without foreign body of left middle finger without damage to nail, subsequent encounter: Secondary | ICD-10-CM | POA: Diagnosis not present

## 2024-04-14 DIAGNOSIS — W5501XD Bitten by cat, subsequent encounter: Secondary | ICD-10-CM | POA: Diagnosis not present

## 2024-04-14 MED ORDER — ESCITALOPRAM OXALATE 10 MG PO TABS
15.0000 mg | ORAL_TABLET | Freq: Every day | ORAL | 3 refills | Status: AC
  Filled 2024-04-14: qty 135, 90d supply, fill #0
  Filled 2024-08-25: qty 135, 90d supply, fill #1

## 2024-04-14 MED ORDER — SULFAMETHOXAZOLE-TRIMETHOPRIM 800-160 MG PO TABS
1.0000 | ORAL_TABLET | Freq: Two times a day (BID) | ORAL | 0 refills | Status: AC
  Filled 2024-04-14: qty 10, 5d supply, fill #0

## 2024-04-14 MED ORDER — METRONIDAZOLE 500 MG PO TABS
500.0000 mg | ORAL_TABLET | Freq: Three times a day (TID) | ORAL | 0 refills | Status: AC
  Filled 2024-04-14: qty 15, 5d supply, fill #0

## 2024-05-26 ENCOUNTER — Other Ambulatory Visit (HOSPITAL_BASED_OUTPATIENT_CLINIC_OR_DEPARTMENT_OTHER): Payer: Self-pay

## 2024-05-26 DIAGNOSIS — M545 Low back pain, unspecified: Secondary | ICD-10-CM | POA: Diagnosis not present

## 2024-05-26 DIAGNOSIS — Z0000 Encounter for general adult medical examination without abnormal findings: Secondary | ICD-10-CM | POA: Diagnosis not present

## 2024-05-26 DIAGNOSIS — F331 Major depressive disorder, recurrent, moderate: Secondary | ICD-10-CM | POA: Diagnosis not present

## 2024-05-26 DIAGNOSIS — J4540 Moderate persistent asthma, uncomplicated: Secondary | ICD-10-CM | POA: Diagnosis not present

## 2024-05-26 DIAGNOSIS — Z79899 Other long term (current) drug therapy: Secondary | ICD-10-CM | POA: Diagnosis not present

## 2024-05-26 MED ORDER — TRIAMCINOLONE ACETONIDE 0.1 % EX CREA
TOPICAL_CREAM | CUTANEOUS | 2 refills | Status: AC
  Filled 2024-05-26 – 2024-06-05 (×2): qty 60, 30d supply, fill #0
  Filled 2024-08-25: qty 60, 30d supply, fill #1

## 2024-06-05 ENCOUNTER — Other Ambulatory Visit (HOSPITAL_BASED_OUTPATIENT_CLINIC_OR_DEPARTMENT_OTHER): Payer: Self-pay

## 2024-07-17 DIAGNOSIS — R55 Syncope and collapse: Secondary | ICD-10-CM | POA: Diagnosis not present

## 2024-07-17 DIAGNOSIS — R519 Headache, unspecified: Secondary | ICD-10-CM | POA: Diagnosis not present

## 2024-08-25 ENCOUNTER — Other Ambulatory Visit (HOSPITAL_BASED_OUTPATIENT_CLINIC_OR_DEPARTMENT_OTHER): Payer: Self-pay

## 2024-08-28 ENCOUNTER — Other Ambulatory Visit (HOSPITAL_BASED_OUTPATIENT_CLINIC_OR_DEPARTMENT_OTHER): Payer: Self-pay

## 2024-08-28 MED ORDER — ESCITALOPRAM OXALATE 10 MG PO TABS
15.0000 mg | ORAL_TABLET | Freq: Every day | ORAL | 1 refills | Status: AC
  Filled 2024-08-28: qty 135, 90d supply, fill #0

## 2024-08-28 MED ORDER — BUDESONIDE-FORMOTEROL FUMARATE 80-4.5 MCG/ACT IN AERO
2.0000 | INHALATION_SPRAY | Freq: Every day | RESPIRATORY_TRACT | 2 refills | Status: AC
  Filled 2024-08-28: qty 10.2, 30d supply, fill #0

## 2024-09-14 ENCOUNTER — Other Ambulatory Visit (HOSPITAL_BASED_OUTPATIENT_CLINIC_OR_DEPARTMENT_OTHER): Payer: Self-pay

## 2024-10-13 ENCOUNTER — Other Ambulatory Visit (HOSPITAL_BASED_OUTPATIENT_CLINIC_OR_DEPARTMENT_OTHER): Payer: Self-pay

## 2024-10-24 DIAGNOSIS — Z1231 Encounter for screening mammogram for malignant neoplasm of breast: Secondary | ICD-10-CM | POA: Diagnosis not present

## 2024-10-24 DIAGNOSIS — Z13820 Encounter for screening for osteoporosis: Secondary | ICD-10-CM | POA: Diagnosis not present

## 2024-10-24 DIAGNOSIS — E282 Polycystic ovarian syndrome: Secondary | ICD-10-CM | POA: Diagnosis not present

## 2024-10-24 DIAGNOSIS — Z01419 Encounter for gynecological examination (general) (routine) without abnormal findings: Secondary | ICD-10-CM | POA: Diagnosis not present

## 2024-10-24 DIAGNOSIS — Z6821 Body mass index (BMI) 21.0-21.9, adult: Secondary | ICD-10-CM | POA: Diagnosis not present

## 2024-10-24 DIAGNOSIS — Z309 Encounter for contraceptive management, unspecified: Secondary | ICD-10-CM | POA: Diagnosis not present

## 2024-10-24 DIAGNOSIS — Z113 Encounter for screening for infections with a predominantly sexual mode of transmission: Secondary | ICD-10-CM | POA: Diagnosis not present

## 2024-10-24 DIAGNOSIS — Z124 Encounter for screening for malignant neoplasm of cervix: Secondary | ICD-10-CM | POA: Diagnosis not present

## 2024-10-31 DIAGNOSIS — R110 Nausea: Secondary | ICD-10-CM | POA: Diagnosis not present

## 2024-10-31 DIAGNOSIS — G43909 Migraine, unspecified, not intractable, without status migrainosus: Secondary | ICD-10-CM | POA: Diagnosis not present

## 2024-12-01 ENCOUNTER — Other Ambulatory Visit (HOSPITAL_BASED_OUTPATIENT_CLINIC_OR_DEPARTMENT_OTHER): Payer: Self-pay

## 2024-12-01 MED ORDER — TRIAMCINOLONE ACETONIDE 0.1 % EX CREA
1.0000 | TOPICAL_CREAM | Freq: Two times a day (BID) | CUTANEOUS | 0 refills | Status: AC | PRN
  Filled 2024-12-01: qty 60, 30d supply, fill #0

## 2025-04-02 ENCOUNTER — Ambulatory Visit: Admitting: Neurology
